# Patient Record
Sex: Male | Born: 1960 | Race: White | Hispanic: No | Smoking: Former smoker
Health system: Southern US, Community
[De-identification: ages and names within clinical notes are randomized; demographics above are authoritative.]

## PROBLEM LIST (undated history)

## (undated) DIAGNOSIS — F1010 Alcohol abuse, uncomplicated: Secondary | ICD-10-CM

## (undated) DIAGNOSIS — H9190 Unspecified hearing loss, unspecified ear: Secondary | ICD-10-CM

## (undated) DIAGNOSIS — E119 Type 2 diabetes mellitus without complications: Secondary | ICD-10-CM

## (undated) DIAGNOSIS — D496 Neoplasm of unspecified behavior of brain: Secondary | ICD-10-CM

## (undated) HISTORY — PX: CERVICAL FUSION: SHX112

---

## 2018-08-26 ENCOUNTER — Inpatient Hospital Stay (HOSPITAL_COMMUNITY)
Admission: EM | Admit: 2018-08-26 | Discharge: 2018-08-30 | DRG: 605 | Disposition: A | Discharge status: Other Acute Inpatient Hospital | Payer: Self-pay | Attending: Internal Medicine | Admitting: Internal Medicine

## 2018-08-26 ENCOUNTER — Encounter (HOSPITAL_COMMUNITY): Payer: Self-pay

## 2018-08-26 ENCOUNTER — Emergency Department (HOSPITAL_COMMUNITY): Payer: Self-pay

## 2018-08-26 DIAGNOSIS — E871 Hypo-osmolality and hyponatremia: Secondary | ICD-10-CM

## 2018-08-26 DIAGNOSIS — X58XXXA Exposure to other specified factors, initial encounter: Secondary | ICD-10-CM | POA: Diagnosis present

## 2018-08-26 DIAGNOSIS — F4310 Post-traumatic stress disorder, unspecified: Secondary | ICD-10-CM | POA: Diagnosis present

## 2018-08-26 DIAGNOSIS — S61502A Unspecified open wound of left wrist, initial encounter: Secondary | ICD-10-CM

## 2018-08-26 DIAGNOSIS — E1165 Type 2 diabetes mellitus with hyperglycemia: Secondary | ICD-10-CM | POA: Diagnosis present

## 2018-08-26 DIAGNOSIS — E119 Type 2 diabetes mellitus without complications: Secondary | ICD-10-CM

## 2018-08-26 DIAGNOSIS — Z6832 Body mass index (BMI) 32.0-32.9, adult: Secondary | ICD-10-CM

## 2018-08-26 DIAGNOSIS — H9190 Unspecified hearing loss, unspecified ear: Secondary | ICD-10-CM | POA: Diagnosis present

## 2018-08-26 DIAGNOSIS — R7989 Other specified abnormal findings of blood chemistry: Secondary | ICD-10-CM | POA: Diagnosis present

## 2018-08-26 DIAGNOSIS — R2681 Unsteadiness on feet: Secondary | ICD-10-CM | POA: Diagnosis present

## 2018-08-26 DIAGNOSIS — F1010 Alcohol abuse, uncomplicated: Secondary | ICD-10-CM

## 2018-08-26 DIAGNOSIS — F10239 Alcohol dependence with withdrawal, unspecified: Secondary | ICD-10-CM | POA: Diagnosis present

## 2018-08-26 DIAGNOSIS — F332 Major depressive disorder, recurrent severe without psychotic features: Secondary | ICD-10-CM | POA: Diagnosis present

## 2018-08-26 DIAGNOSIS — R740 Nonspecific elevation of levels of transaminase and lactic acid dehydrogenase [LDH]: Secondary | ICD-10-CM

## 2018-08-26 DIAGNOSIS — S61512A Laceration without foreign body of left wrist, initial encounter: Principal | ICD-10-CM | POA: Diagnosis present

## 2018-08-26 DIAGNOSIS — R630 Anorexia: Secondary | ICD-10-CM | POA: Diagnosis present

## 2018-08-26 DIAGNOSIS — D496 Neoplasm of unspecified behavior of brain: Secondary | ICD-10-CM

## 2018-08-26 DIAGNOSIS — R7401 Elevation of levels of liver transaminase levels: Secondary | ICD-10-CM

## 2018-08-26 DIAGNOSIS — R945 Abnormal results of liver function studies: Secondary | ICD-10-CM

## 2018-08-26 DIAGNOSIS — K760 Fatty (change of) liver, not elsewhere classified: Secondary | ICD-10-CM | POA: Diagnosis present

## 2018-08-26 DIAGNOSIS — Z62810 Personal history of physical and sexual abuse in childhood: Secondary | ICD-10-CM | POA: Diagnosis present

## 2018-08-26 DIAGNOSIS — T730XXA Starvation, initial encounter: Secondary | ICD-10-CM | POA: Diagnosis present

## 2018-08-26 DIAGNOSIS — R45851 Suicidal ideations: Secondary | ICD-10-CM

## 2018-08-26 DIAGNOSIS — Z23 Encounter for immunization: Secondary | ICD-10-CM

## 2018-08-26 DIAGNOSIS — R634 Abnormal weight loss: Secondary | ICD-10-CM | POA: Diagnosis present

## 2018-08-26 DIAGNOSIS — F1020 Alcohol dependence, uncomplicated: Secondary | ICD-10-CM | POA: Diagnosis present

## 2018-08-26 DIAGNOSIS — R739 Hyperglycemia, unspecified: Secondary | ICD-10-CM

## 2018-08-26 DIAGNOSIS — E860 Dehydration: Secondary | ICD-10-CM | POA: Diagnosis present

## 2018-08-26 DIAGNOSIS — E8729 Other acidosis: Secondary | ICD-10-CM

## 2018-08-26 DIAGNOSIS — E872 Acidosis: Secondary | ICD-10-CM

## 2018-08-26 DIAGNOSIS — T1491XA Suicide attempt, initial encounter: Secondary | ICD-10-CM | POA: Diagnosis present

## 2018-08-26 DIAGNOSIS — Y906 Blood alcohol level of 120-199 mg/100 ml: Secondary | ICD-10-CM | POA: Diagnosis present

## 2018-08-26 DIAGNOSIS — K7010 Alcoholic hepatitis without ascites: Secondary | ICD-10-CM | POA: Diagnosis present

## 2018-08-26 DIAGNOSIS — X788XXA Intentional self-harm by other sharp object, initial encounter: Secondary | ICD-10-CM | POA: Diagnosis present

## 2018-08-26 DIAGNOSIS — D649 Anemia, unspecified: Secondary | ICD-10-CM | POA: Diagnosis present

## 2018-08-26 DIAGNOSIS — Z87891 Personal history of nicotine dependence: Secondary | ICD-10-CM

## 2018-08-26 DIAGNOSIS — R0602 Shortness of breath: Secondary | ICD-10-CM | POA: Diagnosis present

## 2018-08-26 DIAGNOSIS — E440 Moderate protein-calorie malnutrition: Secondary | ICD-10-CM | POA: Diagnosis present

## 2018-08-26 DIAGNOSIS — E876 Hypokalemia: Secondary | ICD-10-CM

## 2018-08-26 HISTORY — DX: Type 2 diabetes mellitus without complications: E11.9

## 2018-08-26 HISTORY — DX: Neoplasm of unspecified behavior of brain: D49.6

## 2018-08-26 HISTORY — DX: Unspecified hearing loss, unspecified ear: H91.90

## 2018-08-26 HISTORY — DX: Alcohol abuse, uncomplicated: F10.10

## 2018-08-26 LAB — ETHANOL: Alcohol, Ethyl (B): 169 mg/dL — ABNORMAL HIGH (ref <10)

## 2018-08-26 LAB — CBC WITH DIFFERENTIAL/PLATELET
ABS IMMATURE GRANULOCYTES: 0.47 10*3/uL — AB (ref 0.00–0.07)
Basophils Absolute: 0 10*3/uL (ref 0.0–0.1)
Basophils Relative: 0 %
Eosinophils Absolute: 0 10*3/uL (ref 0.0–0.5)
Eosinophils Relative: 0 %
HCT: 42.9 % (ref 39.0–52.0)
Hemoglobin: 15.1 g/dL (ref 13.0–17.0)
Immature Granulocytes: 4 %
Lymphocytes Relative: 17 %
Lymphs Abs: 2 10*3/uL (ref 0.7–4.0)
MCH: 31.7 pg (ref 26.0–34.0)
MCHC: 35.2 g/dL (ref 30.0–36.0)
MCV: 89.9 fL (ref 80.0–100.0)
MONO ABS: 1.2 10*3/uL — AB (ref 0.1–1.0)
Monocytes Relative: 10 %
NEUTROS ABS: 8.1 10*3/uL — AB (ref 1.7–7.7)
Neutrophils Relative %: 69 %
Platelets: 254 10*3/uL (ref 150–400)
RBC: 4.77 MIL/uL (ref 4.22–5.81)
RDW: 13 % (ref 11.5–15.5)
WBC: 11.8 10*3/uL — ABNORMAL HIGH (ref 4.0–10.5)
nRBC: 0 % (ref 0.0–0.2)

## 2018-08-26 LAB — COMPREHENSIVE METABOLIC PANEL
ALT: 206 U/L — AB (ref 0–44)
AST: 150 U/L — ABNORMAL HIGH (ref 15–41)
Albumin: 3.9 g/dL (ref 3.5–5.0)
Alkaline Phosphatase: 179 U/L — ABNORMAL HIGH (ref 38–126)
Anion gap: 21 — ABNORMAL HIGH (ref 5–15)
BUN: 14 mg/dL (ref 6–20)
CO2: 29 mmol/L (ref 22–32)
Calcium: 8.2 mg/dL — ABNORMAL LOW (ref 8.9–10.3)
Chloride: 79 mmol/L — ABNORMAL LOW (ref 98–111)
Creatinine, Ser: 0.94 mg/dL (ref 0.61–1.24)
GFR calc Af Amer: >60 mL/min (ref >60)
GFR calc non Af Amer: >60 mL/min (ref >60)
Glucose, Bld: 349 mg/dL — ABNORMAL HIGH (ref 70–99)
Potassium: 2.8 mmol/L — ABNORMAL LOW (ref 3.5–5.1)
Sodium: 129 mmol/L — ABNORMAL LOW (ref 135–145)
Total Bilirubin: 3.2 mg/dL — ABNORMAL HIGH (ref 0.3–1.2)
Total Protein: 7.1 g/dL (ref 6.5–8.1)

## 2018-08-26 MED ORDER — POTASSIUM CHLORIDE 10 MEQ/100ML IV SOLN
10.0000 meq | INTRAVENOUS | Status: AC
  Administered 2018-08-27 (×2): 10 meq via INTRAVENOUS
  Filled 2018-08-26 (×2): qty 100

## 2018-08-26 MED ORDER — POTASSIUM CHLORIDE CRYS ER 20 MEQ PO TBCR
40.0000 meq | EXTENDED_RELEASE_TABLET | Freq: Once | ORAL | Status: AC
  Administered 2018-08-27: 40 meq via ORAL
  Filled 2018-08-26: qty 2

## 2018-08-26 MED ORDER — LORAZEPAM 2 MG/ML IJ SOLN
0.0000 mg | Freq: Two times a day (BID) | INTRAMUSCULAR | Status: DC
  Filled 2018-08-26 (×2): qty 1

## 2018-08-26 MED ORDER — SODIUM CHLORIDE 0.9 % IV BOLUS: 1000.0000 mL | Freq: Once | INTRAVENOUS | Status: DC

## 2018-08-26 MED ORDER — INSULIN REGULAR(HUMAN) IN NACL 100-0.9 UT/100ML-% IV SOLN: INTRAVENOUS | Status: DC

## 2018-08-26 MED ORDER — INSULIN ASPART 100 UNIT/ML ~~LOC~~ SOLN
2.0000 [IU] | Freq: Once | SUBCUTANEOUS | Status: AC
  Administered 2018-08-27: 2 [IU] via SUBCUTANEOUS
  Filled 2018-08-26: qty 1

## 2018-08-26 MED ORDER — DEXTROSE-NACL 5-0.45 % IV SOLN: INTRAVENOUS | Status: DC

## 2018-08-26 MED ORDER — SODIUM CHLORIDE 0.9 % IV SOLN
INTRAVENOUS | Status: DC
  Administered 2018-08-27 – 2018-08-29 (×4): via INTRAVENOUS

## 2018-08-26 MED ORDER — INSULIN REGULAR BOLUS VIA INFUSION
0.0000 [IU] | Freq: Three times a day (TID) | INTRAVENOUS | Status: DC
  Filled 2018-08-26: qty 10

## 2018-08-26 MED ORDER — LORAZEPAM 1 MG PO TABS
0.0000 mg | ORAL_TABLET | Freq: Two times a day (BID) | ORAL | Status: DC
  Administered 2018-08-29 – 2018-08-30 (×3): 2 mg via ORAL
  Filled 2018-08-26: qty 2
  Filled 2018-08-26 (×2): qty 1
  Filled 2018-08-26 (×2): qty 2

## 2018-08-26 MED ORDER — DEXTROSE 50 % IV SOLN: 25.0000 mL | INTRAVENOUS | Status: DC | PRN

## 2018-08-26 MED ORDER — THIAMINE HCL 100 MG/ML IJ SOLN: 100.0000 mg | Freq: Every day | INTRAMUSCULAR | Status: DC

## 2018-08-26 MED ORDER — MAGNESIUM SULFATE 2 GM/50ML IV SOLN
2.0000 g | Freq: Once | INTRAVENOUS | Status: AC
  Administered 2018-08-27: 2 g via INTRAVENOUS
  Filled 2018-08-26: qty 50

## 2018-08-26 MED ORDER — LORAZEPAM 1 MG PO TABS
0.0000 mg | ORAL_TABLET | Freq: Four times a day (QID) | ORAL | Status: AC
  Administered 2018-08-27 (×2): 1 mg via ORAL
  Administered 2018-08-28 (×3): 2 mg via ORAL
  Filled 2018-08-26: qty 1
  Filled 2018-08-26 (×3): qty 2
  Filled 2018-08-26: qty 1
  Filled 2018-08-26 (×2): qty 2

## 2018-08-26 MED ORDER — SODIUM CHLORIDE 0.9 % IV SOLN: INTRAVENOUS | Status: DC

## 2018-08-26 MED ORDER — VITAMIN B-1 100 MG PO TABS
100.0000 mg | ORAL_TABLET | Freq: Every day | ORAL | Status: DC
  Administered 2018-08-27 – 2018-08-30 (×4): 100 mg via ORAL
  Filled 2018-08-26 (×4): qty 1

## 2018-08-26 MED ORDER — TETANUS-DIPHTH-ACELL PERTUSSIS 5-2.5-18.5 LF-MCG/0.5 IM SUSP
0.5000 mL | Freq: Once | INTRAMUSCULAR | Status: AC
  Administered 2018-08-27: 0.5 mL via INTRAMUSCULAR
  Filled 2018-08-26: qty 0.5

## 2018-08-26 MED ORDER — SODIUM CHLORIDE 0.9 % IV BOLUS
1000.0000 mL | Freq: Once | INTRAVENOUS | Status: AC
  Administered 2018-08-27: 1000 mL via INTRAVENOUS

## 2018-08-26 MED ORDER — LORAZEPAM 2 MG/ML IJ SOLN
0.0000 mg | Freq: Four times a day (QID) | INTRAMUSCULAR | Status: AC
  Administered 2018-08-27: 2 mg via INTRAVENOUS
  Filled 2018-08-26: qty 1

## 2018-08-26 MED ORDER — INSULIN ASPART 100 UNIT/ML ~~LOC~~ SOLN: 2.0000 [IU] | Freq: Once | SUBCUTANEOUS | Status: DC

## 2018-08-26 NOTE — BH Assessment (Addendum)
Assessment Note  Todd Rangel is an 58 y.o. male who presents to the ED voluntarily following a suicide attempt. Pt's daughter conducted a wellness check on the pt and when [employer] arrived, pt was found "near 4 empty bottles of liquor and wine" according to triage reports. Pt admits he cut his wrists in a suicide attempt and states he continues to feel suicidal at present. Pt denies any prior suicide attempts and when asked what triggered this current attempt pt stated "I just don't want to live anymore." Pt states he is worried about his massive debt including student loans, he relapsed on alcohol in 2017, and he was recently dx with a brain tumor in November 2019. Pt states he has lost about 20-30 lbs in the past month due to poor appetite. Pt also endorses poor sleeping habits and states he only sleeps after he has consumed heavy amounts of alcohol. Pt denies AVH at present but endorses tactile hallucinations when he is withdrawing from alcohol.   TTS contacted triage nurse Maylon Cos, RN to advise of inpt recommendation. EDP [name], DO contacted to advise of disposition and she states the pt has to be medically admitted prior to inpt treatment. EDP inquired if the pt needs to be IVC'd however pt stated during the TTS assessment that he is willing to sign VOL consent for treatment.  Diagnosis: MDD, recurrent episode, w/o psychosis; Alcohol use disorder, severe  Past Medical History:  Past Medical History:  Diagnosis Date  . Alcohol abuse   . Brain tumor ([place])   . Diabetes mellitus without complication ([place])   . 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" ([place]    History reviewed. No pertinent surgical history.  Family History: History reviewed. No pertinent family history.  Social History:  reports that he has quit smoking. He has never used smokeless tobacco. He reports current alcohol use. No history on file for drug.  Additional Social History:  Alcohol / Drug  Use Pain Medications: See MAR Prescriptions: See MAR Over the Counter: See MAR History of alcohol / drug use?: Yes Longest period of sobriety (when/how long): 14 years Negative Consequences of Use: Financial, Personal relationships Withdrawal Symptoms: Patient aware of relationship between substance abuse and physical/medical complications Substance #1 Name of Substance 1: Alcohol 1 - Age of First Use: 9 1 - Amount (size/oz): excessive 1 - Frequency: daily 1 - Duration: ongoing 1 - Last Use / Amount: 08/26/2018  CIWA: CIWA-Ar BP: 137/90 Pulse Rate: 91 Nausea and Vomiting: no nausea and no vomiting Tactile Disturbances: none Tremor: no tremor Auditory Disturbances: not present Paroxysmal Sweats: no sweat visible Visual Disturbances: not present Anxiety: no anxiety, at ease Headache, Fullness in Head: none present Agitation: normal activity Orientation and Clouding of Sensorium: oriented and can do serial additions CIWA-Ar Total: 0 COWS:    Allergies: No Known Allergies  Home Medications: (Not in a hospital admission)   OB/GYN Status:  No LMP for male patient.  General Assessment Data [facility] of Assessment: [facility] TTS Assessment: In system Is this a Tele or Face-to-Face Assessment?: Face-to-Face Is this an Initial Assessment or a Re-assessment for this encounter?: Initial Assessment Patient Accompanied by:: N/A Language Other than English: No Living Arrangements: Other (Comment) What gender do you identify as?: Male Marital status: Divorced Pregnancy Status: No Living Arrangements: Alone Can pt return to current living arrangement?: Yes Admission Status: Voluntary Is patient capable of signing voluntary admission?: Yes Referral Source: Self/Family/Friend Insurance type:  none     Crisis Care Plan Living Arrangements: Alone [facility]iatrist[facility]   Education Status Is patient currently in school?: No Is the  patient employed, unemployed or receiving disability?: Receiving disability income  Risk to self with the past 6 months Suicidal Ideation: Yes-Currently Present Has patient been a risk to self within the past 6 months prior to admission? : Yes Suicidal Intent: Yes-Currently Present Has patient had any suicidal intent within the past 6 months prior to admission? : Yes Is patient at risk for suicide?: Yes Suicidal Plan?: Yes-Currently Present Has patient had any suicidal plan within the past 6 months prior to admission? : Yes Specify Current Suicidal Plan: pt cut his wrists in a suicide attempt Access to Means: Yes Specify Access to Suicidal Means: pt has access to knives and sharps  What has been your use of drugs/alcohol within the last 12 months?: alcohol Previous Attempts/Gestures: No Other Self Harm Risks: depression, substance abuse, intent and plan for suicide  Triggers for Past Attempts: None known Intentional Self Injurious Behavior: None Family Suicide History: No Recent stressful life event(s): Recent negative physical changes, Financial Problems Persecutory voices/beliefs?: No Depression: Yes Depression Symptoms: Loss of interest in usual pleasures, Feeling worthless/self pity, Isolating, Guilt, Despondent Substance abuse history and/or treatment for substance abuse?: Yes Suicide prevention information given to non-admitted patients: Not applicable  Risk to Others within the past 6 months Homicidal Ideation: No Does patient have any lifetime risk of violence toward others beyond the six months prior to admission? : No Thoughts of Harm to Others: No Current Homicidal Intent: No Current Homicidal Plan: No Access to Homicidal Means: No History of harm to others?: No Assessment of Violence: None Noted Does patient have access to weapons?: No Criminal Charges Pending?: No Does patient have a court date: No Is patient on probation?: No  Psychosis Hallucinations:  Tactile(when withdrawing from alcohol) Delusions: None noted  Mental Status Report Appearance/Hygiene: Disheveled Eye Contact: Good Motor Activity: Freedom of movement Speech: Soft Level of Consciousness: Quiet/awake Mood: Depressed, Despair, Helpless, Sad, Sullen, Worthless, low self-esteem Affect: Flat, Sad, Sullen, Depressed, Blunted Anxiety Level: None Thought Processes: Relevant, Coherent Judgement: Impaired Orientation: Time, Situation, Appropriate for developmental age, Person, Place Obsessive Compulsive Thoughts/Behaviors: None  Cognitive Functioning Concentration: Normal Memory: Recent Intact, Remote Intact Is patient IDD: No Insight: Poor Impulse Control: Poor Appetite: Poor Have you had any weight changes? : Loss Amount of the weight change? (lbs): 30 lbs Sleep: Decreased Total Hours of Sleep: 6 Vegetative Symptoms: None  ADLScreening [facility] Assessment Services) Patient's cognitive ability adequate to safely complete daily activities?: Yes Patient able to express need for assistance with ADLs?: Yes Independently performs ADLs?: Yes (appropriate for developmental age)  Prior Inpatient Therapy Prior Inpatient Therapy: Yes Prior Therapy Dates: 2018 [facility] Reason for Treatment: depression  Prior Outpatient Therapy Prior Outpatient Therapy: Yes Prior Therapy Dates: ongoing [facility] Reason for Treatment: depression Does patient have an ACCT team?: No Does patient have Intensive In-House Services?  : No Does patient have [facility] services? : No Does patient have P4CC services?: No  ADL Screening (condition at time of admission) Patient's cognitive ability adequate to safely complete daily activities?: Yes Is the patient deaf or have difficulty hearing?: No Does the patient have difficulty seeing, even when wearing glasses/contacts?: No Does the patient have difficulty concentrating, remembering, or  making decisions?: No Patient able to express need for assistance with ADLs?:  Yes Does the patient have difficulty dressing or bathing?: No Independently performs ADLs?: Yes (appropriate for developmental age) Does the patient have difficulty walking or climbing stairs?: No Weakness of Legs: None Weakness of Arms/Hands: None  Home Assistive Devices/Equipment Home Assistive Devices/Equipment: None    Abuse/Neglect Assessment (Assessment to be complete while patient is alone) Abuse/Neglect Assessment Can Be Completed: Yes Physical Abuse: Denies Verbal Abuse: Denies Sexual Abuse: Denies Exploitation of patient/patient's resources: Denies Self-Neglect: Denies     Regulatory affairs officer (For Healthcare) Does Patient Have a Medical Advance Directive?: No Would patient like information on creating a medical advance directive?: No - Patient declined          Disposition: TTS contacted triage nurse Maylon Cos, RN to advise of inpt recommendation. EDP [name], DO contacted to advise of disposition and she states the pt has to be medically admitted prior to inpt treatment. EDP inquired if the pt needs to be IVC'd however pt stated during the TTS assessment that he is willing to sign VOL consent for treatment.  Disposition Initial Assessment Completed for this Encounter: Yes Disposition of Patient: Admit Type of inpatient treatment program: Adult Patient refused recommended treatment: No  On Site Evaluation by:   Reviewed with Physician:    Lyanne Co 08/27/2018 12:20 AM

## 2018-08-26 NOTE — Progress Notes (Signed)
Patriciaann Clan, PA recommends inpt treatment.   Lind Covert, MSW, LCSW Therapeutic Triage Specialist  [phone]

## 2018-08-26 NOTE — ED Triage Notes (Signed)
Pt to Ed by [employer] with c/o of suicidal attempt. Pt daughter called [employer] to do a welfare check on patient. Pt was found buy [employer] and daughter fell out on the bed with eyes rolled back and cut his left wrist yesterday with a razor. Pt also stated that he tried to drill bit his head. Pt was found near 4 empty bottles of liquor and wine. Pt was recently diagnosed with a brain tumor in November, and since has relapsed back into drinking alcohol. Pt states that "he tried to kill himself and just wanted to die".

## 2018-08-26 NOTE — Progress Notes (Signed)
TTS contacted triage nurse Maylon Cos, RN to advise of inpt recommendation. EDP [name], DO contacted to advise of disposition and she states the pt has to be medically admitted prior to inpt treatment. EDP inquired if the pt needs to be IVC'd however pt stated during the TTS assessment that he is willing to sign VOL consent for treatment.  Lind Covert, MSW, LCSW Therapeutic Triage Specialist  [phone]

## 2018-08-26 NOTE — H&P (Addendum)
History and Physical    Todd Rangel ZOX:[id number] DOB: 21-Jul-1961 DOA: 08/26/2018  Referring MD/NP/PA:   PCP: [place]   Patient coming from:  The patient is coming from home.  At baseline, pt is independent for most of ADL.        Chief Complaint: Suicidal attempt  HPI: Todd Rangel is a 58 y.o. male with medical history significant of alcohol abuse, diabetes mellitus, recently diagnosed brain tumor, hearing loss, who presents with suicidal attempt.  Per report, pt had suicidal attempt by having cut his left wrist with razor at noon of yesterday. Pt also reported that that he tried to drill bit his head. Pt daughter called [employer] to do a welfare check on patient. Pt was found by [employer] and daughter fell out on the bed, with near 4 empty bottles of liquor and wine. Pt was recently diagnosed with a brain tumor in November, and since has relapsed back into drinking alcohol. Pt states that "he tried to kill himself and just wanted to die.  Endorses hx of "brain tumor" that was dx at [facility] in November, but will not be more specific. For some reason, I could not open "Care Everywhere" tonight.  When I saw pt in ED, he is quiet, no agitation. He has an open cut in left wrist without active bleeding. He states that he has suicidal ideation, but no plan now.  Denies homicidal ideation.  He denies any chest pain, cough, shortness of breath.  No nausea, vomiting, diarrhea, abdominal pain, symptoms of UTI or unilateral weakness.  He states that he has  poor appetite, decreased oral intake recently, and lost nearly 30 pounds recently.  ED Course: pt was found to have WBC 11.8, sodium 129, abnormal liver function (ALP 179, ALT 2 6, AST 150, total bilirubin 3.2), alcohol level 169, potassium 2.8, bicarbonate of 29, creatinine normal, BUN normal, blood sugar 349, anion gap 21, temperature normal, heart rate in 90s, no tachypnea, oxygen saturation 96% on room air.  Chest x-ray negative.  X-ray of left  wrist is negative for bony fracture.   CT of head: 1. Moderate degree of small vessel ischemia in the periventricular, deep and subcortical white matter. Mild sulcal and ventricular prominence advanced for age. 2. No acute intracranial abnormality.  US-RUQ: 1. Hepatomegaly with fatty infiltration of the liver. 2. No gallstone  Review of Systems:   General: no fevers, chills, no body weight gain, has poor appetite, has fatigue HEENT: no blurry vision, hearing changes or sore throat Respiratory: no dyspnea, coughing, wheezing CV: no chest pain, no palpitations GI: no nausea, vomiting, abdominal pain, diarrhea, constipation GU: no dysuria, burning on urination, increased urinary frequency, hematuria  Ext: no leg edema Neuro: no unilateral weakness, numbness, or tingling, no vision change or hearing loss Skin: no rash. Has an open wound in left wrist MSK: No muscle spasm, no deformity, no limitation of range of movement in spin Heme: No easy bruising.  Psych: has suicidal attempt and ideation, no homicidal ideation. Travel history: No recent long distant travel.  Allergy: No Known Allergies  Past Medical History:  Diagnosis Date  . Alcohol abuse   . Brain tumor ([place])   . Diabetes mellitus without complication ([place])   . 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 CERVICAL FUSION     per pt's report    Social History:  reports  that he has quit smoking. He has never used smokeless tobacco. He reports current alcohol use. He reports that he does not use drugs.  Family History:  Family History  Problem Relation Age of Onset  . Lung cancer Mother   . Liver cancer Father      Prior to Admission medications   Not on File    Physical Exam: Vitals:   08/26/18 2105 08/26/18 2123 08/27/18 0109 08/27/18 0112  BP: 137/90 137/90 117/81 117/81  Pulse: 91 91 90 97  Resp: 17  17   Temp: 99 F  (37.2 C)     TempSrc: Oral     SpO2: 96%  100%    General: Not in acute distress.  Thin body habitus. HEENT:       Eyes: PERRL, EOMI, no scleral icterus.       ENT: No discharge from the ears and nose, no pharynx injection, no tonsillar enlargement.        Neck: No JVD, no bruit, no mass felt. Heme: No neck lymph node enlargement. Cardiac: S1/S2, RRR, No murmurs, No gallops or rubs. Respiratory: No rales, wheezing, rhonchi or rubs. GI: Soft, nondistended, nontender, no rebound pain, no organomegaly, BS present. GU: No hematuria Ext: No pitting leg edema bilaterally. 2+DP/PT pulse bilaterally. Musculoskeletal: No joint deformities, No joint redness or warmth, no limitation of ROM in spin. Skin: No rashes. Has an open wound in left wrist, approximately 2.5 cm, no active bleeding Neuro: Alert, oriented X3, cranial nerves II-XII grossly intact, moves all extremities normally.  Psych: Has suicidal ideation, no homicidal ideation. Labs on Admission: I have personally reviewed following labs and imaging studies  CBC: Recent Labs  Lab 08/26/18 2128  WBC 11.8*  NEUTROABS 8.1*  HGB 15.1  HCT 42.9  MCV 89.9  PLT 503   Basic Metabolic Panel: Recent Labs  Lab 08/26/18 2128 08/26/18 2318  NA 129*  --   K 2.8*  --   CL 79*  --   CO2 29  --   GLUCOSE 349*  --   BUN 14  --   CREATININE 0.94  --   CALCIUM 8.2*  --   MG  --  2.6*   GFR: CrCl cannot be calculated (Unknown ideal weight.). Liver Function Tests: Recent Labs  Lab 08/26/18 2128  AST 150*  ALT 206*  ALKPHOS 179*  BILITOT 3.2*  PROT 7.1  ALBUMIN 3.9   No results for input(s): LIPASE, AMYLASE in the last 168 hours. No results for input(s): AMMONIA in the last 168 hours. Coagulation Profile: No results for input(s): INR, PROTIME in the last 168 hours. Cardiac Enzymes: No results for input(s): CKTOTAL, CKMB, CKMBINDEX, TROPONINI in the last 168 hours. BNP (last 3 results) No results for input(s): PROBNP in the  last 8760 hours. HbA1C: No results for input(s): HGBA1C in the last 72 hours. CBG: No results for input(s): GLUCAP in the last 168 hours. Lipid Profile: No results for input(s): CHOL, HDL, LDLCALC, TRIG, CHOLHDL, LDLDIRECT in the last 72 hours. Thyroid Function Tests: No results for input(s): TSH, T4TOTAL, FREET4, T3FREE, THYROIDAB in the last 72 hours. Anemia Panel: No results for input(s): VITAMINB12, FOLATE, FERRITIN, TIBC, IRON, RETICCTPCT in the last 72 hours. Urine analysis:    Component Value Date/Time   COLORURINE AMBER (A) 08/26/2018 2349   APPEARANCEUR CLEAR 08/26/2018 2349   LABSPEC 1.030 08/26/2018 2349   PHURINE 6.0 08/26/2018 2349   GLUCOSEU >=500 (A) 08/26/2018 2349   HGBUR SMALL (A) 08/26/2018  [place] 08/26/2018 2349   KETONESUR 20 (A) 08/26/2018 2349   PROTEINUR 30 (A) 08/26/2018 2349   NITRITE NEGATIVE 08/26/2018 2349   LEUKOCYTESUR NEGATIVE 08/26/2018 2349   Sepsis Labs: @LABRCNTIP (procalcitonin:4,lacticidven:4) )No results found for this or any previous visit (from the past 240 hour(s)).   Radiological Exams on Admission: Dg Chest 2 View  Result Date: 08/26/2018 CLINICAL DATA:  Suicidal attempt.  ETOH. EXAM: CHEST - 2 VIEW COMPARISON:  None. FINDINGS: The cardiomediastinal silhouette is unremarkable. There is no evidence of focal airspace disease, pulmonary edema, suspicious pulmonary nodule/mass, pleural effusion, or pneumothorax. No acute bony abnormalities are identified. IMPRESSION: No active cardiopulmonary disease. Electronically Signed   By: Margarette Canada M.D.   On: 08/26/2018 21:54   Dg Wrist Complete Left  Result Date: 08/26/2018 CLINICAL DATA:  Laceration to LEFT wrist. EXAM: LEFT WRIST - COMPLETE 3+ VIEW COMPARISON:  None. FINDINGS: No acute fracture, subluxation or dislocation. Mild soft tissue swelling noted. No radiopaque foreign body. No acute bony abnormalities identified. IMPRESSION: Mild soft tissue swelling without acute  bony abnormality or radiopaque foreign body. Electronically Signed   By: Margarette Canada M.D.   On: 08/26/2018 21:52   Ct Head Wo Contrast  Result Date: 08/26/2018 CLINICAL DATA:  Suicidal attempt. Alcohol related disorder. EXAM: CT HEAD WITHOUT CONTRAST TECHNIQUE: Contiguous axial images were obtained from the base of the skull through the vertex without intravenous contrast. COMPARISON:  None. FINDINGS: Brain: Moderate degree of small vessel ischemia in the periventricular, deep and subcortical white matter. No acute intraparenchymal hemorrhage, midline shift or edema. Mild sulcal and ventricular prominence advanced for age. Midline fourth ventricle and basal cisterns without effacement. No definite intra-axial mass nor extra-axial fluid. Vascular: No hyperdense vessel sign. Skull: Normal. Negative for fracture or focal lesion. Sinuses/Orbits: No acute finding. Other: None IMPRESSION: 1. Moderate degree of small vessel ischemia in the periventricular, deep and subcortical white matter. Mild sulcal and ventricular prominence advanced for age. 2. No acute intracranial abnormality. Electronically Signed   By: Ashley Royalty M.D.   On: 08/26/2018 21:49   US Abdomen Limited  Result Date: 08/27/2018 CLINICAL DATA:  58 year old male with elevated LFTs. EXAM: ULTRASOUND ABDOMEN LIMITED RIGHT UPPER QUADRANT COMPARISON:  None. FINDINGS: Gallbladder: Small amount of sludge in the gallbladder. No gallstone, gallbladder wall thickening or pericholecystic fluid. Negative sonographic Murphy's sign. Common bile duct: Not visualized. Liver: There is diffuse increased liver echogenicity most consistent with fatty infiltration. Superimposed fibrosis or inflammation is not excluded. Clinical correlation is recommended. There is liver is enlarged measuring approximately 21 cm in length. Portal vein is patent on color Doppler imaging with normal direction of blood flow towards the liver. IMPRESSION: 1. Hepatomegaly with fatty  infiltration of the liver. 2. No gallstone. Electronically Signed   By: Anner Crete M.D.   On: 08/27/2018 00:38     EKG:   Not done in ED, will get one.   Assessment/Plan Principal Problem:   Suicidal behavior with attempted self-injury [facility]) Active Problems:   Alcohol abuse   Brain tumor ([place])   Diabetes mellitus without complication ([place])   Hyponatremia   Abnormal LFTs   Hypokalemia   Open wound of left wrist   Protein-calorie malnutrition, moderate ([place])   Suicidal behavior with attempted self-injury [place]): pt still has SI, but no HI. He is calm now.  Patient was evaluated by the psychiatry in ED, recommended medical clearance before admission to behavioral unit.  -place on tele bed for obs -  Sitter at the bedside, -Suicidal precaution - f/u tylenol and salicylate level -psych consult is requested via Epic   Alcohol abuse: -CiWA protocol  Diabetes mellitus without complication: Blood sugar is elevated at 349.  No A1c data is available.  Patient belongs to [facility].  Patient has anion gap 21, but bicarbonate is 29.  Urinalysis showed ketones 20, which is likely due to starvation rather than DKA. -Start Lantus 5 units daily -SSI -check A1c -IVF: 2 L normal saline, followed by 125 cc/h  Brain tumor [facility]): no detailed information available.  CT head did not show tumor or midline shift. -May need to get medical record from [place] which is likely difficult.  Hyponatremia: Sodium 129.  Most likely due to decreased oral intake and dehydration. -IV normal saline as above  Abnormal LFTs: US-RUQ showed hepatomegaly with fatty infiltration of the liver and no gallstone. Partially due to alcohol abuse. -check hepatitis panel and HIV antibody -Avoid using liver toxic medications, such as a Tylenol  Hypokalemia: K=2.8  on admission. - Repleted - Check Mg level - Give 1 g of magnesium sulfate  Open wound of left wrist: happened more than 24 hours, will not close now -start  Ancef per pharmacy for prophylaxis -Wound care consult  Protein-calorie malnutrition, moderate ([place]): -Consult to nutrition   DVT ppx: SCD Code Status: Full code Family Communication: None at bed side.   Disposition Plan:  Anticipate discharge [place] called:  none Admission status: Obs / tele      Date of Service 08/27/2018    Ivor Costa [facility]   If 7PM-7AM, please contact night-coverage [email or web address] Password [email or web address] 08/27/2018, 4:16 AM

## 2018-08-26 NOTE — ED Notes (Signed)
Trever Streater (daughter) [phone]

## 2018-08-26 NOTE — ED Provider Notes (Signed)
[facility] DEPT Provider Note   CSN: [id number] Arrival date & time: 08/26/18  2050     History   Chief Complaint Chief Complaint  Patient presents with  . Suicide Attempt    HPI Todd Rangel is a 58 y.o. male.  The history is provided by the patient, the EMS personnel and a relative. History limited by: psychiatric disorder.   Pt was seen at 2115.  Per EMS, pt's daughter and pt: Pt called [employer] to do welfare check on pt. [employer] found pt in bed with multiple empty bottles of liquor and wine. Pt states he cut his left wrist with a razor yesterday in [place], as well as tried to "drill bit" his head. States he "was trying to kill himself," and "just wanted to die." Endorses hx of "brain tumor" that was dx at [facility] in November (the [place] per his daughter), but will not be more specific. Denies AVH, no HI.    Td unknown Past Medical History:  Diagnosis Date  . Alcohol abuse   . Diabetes mellitus without complication ([place])   . 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   History reviewed. No pertinent surgical history.      Home Medications    Prior to Admission medications   Not on File    Family History History reviewed. No pertinent family history.  Social History Social History   Tobacco Use  . Smoking status: Former Research scientist (life sciences)  . Smokeless tobacco: Never Used  Substance Use Topics  . Alcohol use: Yes  . Drug use: Not on file     Allergies   Patient has no known allergies.   Review of Systems Review of Systems  Unable to perform ROS: Psychiatric disorder     Physical Exam Updated Vital Signs BP 137/90   Pulse 91   Temp 99 F (37.2 C) (Oral)   Resp 17   SpO2 96%    Patient Vitals for the past 24 hrs:  BP Temp Temp src Pulse Resp SpO2  08/26/18 2123 137/90 - - 91 - -  08/26/18 2105 137/90 99 F (37.2 C) Oral 91 17 96 %  08/26/18  2059 - - - - - 95 %      Physical Exam 2120: Physical examination:  Nursing notes reviewed; Vital signs and O2 SAT reviewed;  Constitutional: Well developed, Well nourished, Well hydrated, In no acute distress; Head:  Normocephalic, atraumatic. No lacs.; Eyes: EOMI, PERRL, No scleral icterus; ENMT: Mouth and pharynx normal, Mucous membranes moist; Neck: Supple, Full range of motion; Cardiovascular: Regular rate and rhythm; Respiratory: Breath sounds clear, No wheezes.  Speaking full sentences with ease, Normal respiratory effort/excursion; Chest: No deformity, Movement normal; Abdomen: Nondistended; Extremities: No deformity. Dried black blood on left wrist. +two separate approximately 3cm vertical lacs to L volar wrist.  Wounds healing with clots and dried blood. Left wrist and fingers with FROM.  No apparent gross retained foreign body, no apparent significant involvement of deep structures such as bone/joint/tendon/or neurovascular involvement noted.  Baseline strength and sensation to left hand/fingers with normal light touch, distal NMS intact with cap refill <2sec, and strong peripheral pulses.  Left hand with motor strength intact, with motor strength 5/5 through E/F (FDP,FDS) against resistance and 2-point discrimination intact at 79mm in left fingers. NT left elbow/wrist/hand/fingers..; Neuro: AA&Ox3, Major CN grossly intact.  Speech clear. No  gross focal motor deficits in extremities. Climbs on and off stretcher easily by himself. Gait steady.; Skin: Color normal, Warm, Dry.; Psych:  Affect flat.    ED Treatments / Results  Labs (all labs ordered are listed, but only abnormal results are displayed)   EKG None  Radiology   Procedures Procedures (including critical care time)  Medications Ordered in ED Medications  Tdap (BOOSTRIX) injection 0.5 mL (has no administration in time range)     Initial Impression / Assessment and Plan / ED Course  I have reviewed the triage vital  signs and the nursing notes.  Pertinent labs & imaging results that were available during my care of the patient were reviewed by me and considered in my medical decision making (see chart for details).  MDM Reviewed: nursing note and vitals Interpretation: x-ray, labs and CT scan Total time providing critical care: 30-74 minutes. This excludes time spent performing separately reportable procedures and services. Consults: admitting MD   CRITICAL CARE Performed by: [name] Total critical care time: 35 minutes Critical care time was exclusive of separately billable procedures and treating other patients. Critical care was necessary to treat or prevent imminent or life-threatening deterioration. Critical care was time spent personally by me on the following activities: development of treatment plan with patient and/or surrogate as well as nursing, discussions with consultants, evaluation of patient's response to treatment, examination of patient, obtaining history from patient or surrogate, ordering and performing treatments and interventions, ordering and review of laboratory studies, ordering and review of radiographic studies, pulse oximetry and re-evaluation of patient's condition.   Results for orders placed or performed during the hospital encounter of 08/26/18  Comprehensive metabolic panel  Result Value Ref Range   Sodium 129 (L) 135 - 145 mmol/L   Potassium 2.8 (L) 3.5 - 5.1 mmol/L   Chloride 79 (L) 98 - 111 mmol/L   CO2 29 22 - 32 mmol/L   Glucose, Bld 349 (H) 70 - 99 mg/dL   BUN 14 6 - 20 mg/dL   Creatinine, Ser 0.94 0.61 - 1.24 mg/dL   Calcium 8.2 (L) 8.9 - 10.3 mg/dL   Total Protein 7.1 6.5 - 8.1 g/dL   Albumin 3.9 3.5 - 5.0 g/dL   AST 150 (H) 15 - 41 U/L   ALT 206 (H) 0 - 44 U/L   Alkaline Phosphatase 179 (H) 38 - 126 U/L   Total Bilirubin 3.2 (H) 0.3 - 1.2 mg/dL   GFR calc non Af Amer >60 >60 mL/min   GFR calc Af Amer >60 >60 mL/min   Anion gap 21 (H) 5 - 15   Ethanol  Result Value Ref Range   Alcohol, Ethyl (B) 169 (H) <10 mg/dL  CBC with Differential/Platelet  Result Value Ref Range   WBC 11.8 (H) 4.0 - 10.5 K/uL   RBC 4.77 4.22 - 5.81 MIL/uL   Hemoglobin 15.1 13.0 - 17.0 g/dL   HCT 42.9 39.0 - 52.0 %   MCV 89.9 80.0 - 100.0 fL   MCH 31.7 26.0 - 34.0 pg   MCHC 35.2 30.0 - 36.0 g/dL   RDW 13.0 11.5 - 15.5 %   Platelets 254 150 - 400 K/uL   nRBC 0.0 0.0 - 0.2 %   Neutrophils Relative % 69 %   Neutro Abs 8.1 (H) 1.7 - 7.7 K/uL   Lymphocytes Relative 17 %   Lymphs Abs 2.0 0.7 - 4.0 K/uL   Monocytes Relative 10 %   Monocytes Absolute 1.2 (H)  0.1 - 1.0 K/uL   Eosinophils Relative 0 %   Eosinophils Absolute 0.0 0.0 - 0.5 K/uL   Basophils Relative 0 %   Basophils Absolute 0.0 0.0 - 0.1 K/uL   Immature Granulocytes 4 %   Abs Immature Granulocytes 0.47 (H) 0.00 - 0.07 K/uL   Dg Chest 2 View Result Date: 08/26/2018 CLINICAL DATA:  Suicidal attempt.  ETOH. EXAM: CHEST - 2 VIEW COMPARISON:  None. FINDINGS: The cardiomediastinal silhouette is unremarkable. There is no evidence of focal airspace disease, pulmonary edema, suspicious pulmonary nodule/mass, pleural effusion, or pneumothorax. No acute bony abnormalities are identified. IMPRESSION: No active cardiopulmonary disease. Electronically Signed   By: Margarette Canada M.D.   On: 08/26/2018 21:54   Dg Wrist Complete Left Result Date: 08/26/2018 CLINICAL DATA:  Laceration to LEFT wrist. EXAM: LEFT WRIST - COMPLETE 3+ VIEW COMPARISON:  None. FINDINGS: No acute fracture, subluxation or dislocation. Mild soft tissue swelling noted. No radiopaque foreign body. No acute bony abnormalities identified. IMPRESSION: Mild soft tissue swelling without acute bony abnormality or radiopaque foreign body. Electronically Signed   By: Margarette Canada M.D.   On: 08/26/2018 21:52   Ct Head Wo Contrast Result Date: 08/26/2018 CLINICAL DATA:  Suicidal attempt. Alcohol related disorder. EXAM: CT HEAD WITHOUT CONTRAST  TECHNIQUE: Contiguous axial images were obtained from the base of the skull through the vertex without intravenous contrast. COMPARISON:  None. FINDINGS: Brain: Moderate degree of small vessel ischemia in the periventricular, deep and subcortical white matter. No acute intraparenchymal hemorrhage, midline shift or edema. Mild sulcal and ventricular prominence advanced for age. Midline fourth ventricle and basal cisterns without effacement. No definite intra-axial mass nor extra-axial fluid. Vascular: No hyperdense vessel sign. Skull: Normal. Negative for fracture or focal lesion. Sinuses/Orbits: No acute finding. Other: None IMPRESSION: 1. Moderate degree of small vessel ischemia in the periventricular, deep and subcortical white matter. Mild sulcal and ventricular prominence advanced for age. 2. No acute intracranial abnormality. Electronically Signed   By: Ashley Royalty M.D.   On: 08/26/2018 21:49    2350:   Td updated. Left wrist wound too old to close with sutures; wound care provided. Potassium repleted PO; magnesium pending. LFT's elevated, abd benign on exam; no old to compare. [place] abd ordered. IVF and SQ insulin ordered for elevated glucose and AG. Suspect likely alcoholic ketoacidosis more than DKA; UA pending. Na corrects to 133 for glucose. Pt will need medical admission before psych admission; IVC paperwork completed. CIWA protocol ordered with thiamine.  T/C returned from [facility] Dr. Blaine Hamper, case discussed, including:  HPI, pertinent PM/SHx, VS/PE, dx testing, ED course and treatment:  Agreeable to admit.     Final Clinical Impressions(s) / ED Diagnoses   Final diagnoses:  None    ED Discharge Orders    None       [name], DO 08/30/18 1330

## 2018-08-26 NOTE — ED Notes (Signed)
Bed: KP22 Expected date:  Expected time:  Means of arrival:  Comments: TR6

## 2018-08-27 ENCOUNTER — Encounter (HOSPITAL_COMMUNITY): Payer: Self-pay | Admitting: Internal Medicine

## 2018-08-27 ENCOUNTER — Other Ambulatory Visit: Payer: Self-pay

## 2018-08-27 DIAGNOSIS — E440 Moderate protein-calorie malnutrition: Secondary | ICD-10-CM | POA: Diagnosis present

## 2018-08-27 DIAGNOSIS — S61502A Unspecified open wound of left wrist, initial encounter: Secondary | ICD-10-CM | POA: Diagnosis present

## 2018-08-27 DIAGNOSIS — T1491XA Suicide attempt, initial encounter: Secondary | ICD-10-CM | POA: Insufficient documentation

## 2018-08-27 DIAGNOSIS — F332 Major depressive disorder, recurrent severe without psychotic features: Secondary | ICD-10-CM | POA: Diagnosis present

## 2018-08-27 LAB — URINALYSIS, ROUTINE W REFLEX MICROSCOPIC
Bacteria, UA: NONE SEEN
Bilirubin Urine: NEGATIVE
Glucose, UA: >=500 mg/dL — AB
Ketones, ur: 20 mg/dL — AB
Leukocytes, UA: NEGATIVE
Nitrite: NEGATIVE
Protein, ur: 30 mg/dL — AB
Specific Gravity, Urine: 1.03 (ref 1.005–1.030)
pH: 6 (ref 5.0–8.0)

## 2018-08-27 LAB — CBG MONITORING, ED
Glucose-Capillary: 255 mg/dL — ABNORMAL HIGH (ref 70–99)
Glucose-Capillary: 342 mg/dL — ABNORMAL HIGH (ref 70–99)
Glucose-Capillary: 280 mg/dL — ABNORMAL HIGH (ref 70–99)

## 2018-08-27 LAB — GLUCOSE, CAPILLARY
GLUCOSE-CAPILLARY: 258 mg/dL — AB (ref 70–99)
Glucose-Capillary: 320 mg/dL — ABNORMAL HIGH (ref 70–99)

## 2018-08-27 LAB — RAPID URINE DRUG SCREEN, HOSP PERFORMED
Amphetamines: NOT DETECTED
Barbiturates: NOT DETECTED
Benzodiazepines: NOT DETECTED
Cocaine: NOT DETECTED
Opiates: NOT DETECTED
Tetrahydrocannabinol: NOT DETECTED

## 2018-08-27 LAB — CBC
HCT: 38.5 % — ABNORMAL LOW (ref 39.0–52.0)
Hemoglobin: 13.3 g/dL (ref 13.0–17.0)
MCH: 30.9 pg (ref 26.0–34.0)
MCHC: 34.5 g/dL (ref 30.0–36.0)
MCV: 89.3 fL (ref 80.0–100.0)
Platelets: 196 10*3/uL (ref 150–400)
RBC: 4.31 MIL/uL (ref 4.22–5.81)
RDW: 13 % (ref 11.5–15.5)
WBC: 11 10*3/uL — AB (ref 4.0–10.5)
nRBC: 0 % (ref 0.0–0.2)

## 2018-08-27 LAB — BASIC METABOLIC PANEL
Anion gap: 12 (ref 5–15)
BUN: 13 mg/dL (ref 6–20)
CO2: 29 mmol/L (ref 22–32)
Calcium: 7.4 mg/dL — ABNORMAL LOW (ref 8.9–10.3)
Chloride: 88 mmol/L — ABNORMAL LOW (ref 98–111)
Creatinine, Ser: 0.83 mg/dL (ref 0.61–1.24)
GFR calc Af Amer: >60 mL/min (ref >60)
GFR calc non Af Amer: >60 mL/min (ref >60)
Glucose, Bld: 231 mg/dL — ABNORMAL HIGH (ref 70–99)
Potassium: 3.8 mmol/L (ref 3.5–5.1)
Sodium: 129 mmol/L — ABNORMAL LOW (ref 135–145)

## 2018-08-27 LAB — HIV ANTIBODY (ROUTINE TESTING W REFLEX): HIV Screen 4th Generation wRfx: NONREACTIVE

## 2018-08-27 LAB — LACTIC ACID, PLASMA: Lactic Acid, Venous: 1.6 mmol/L (ref 0.5–1.9)

## 2018-08-27 LAB — HEMOGLOBIN A1C
Hgb A1c MFr Bld: 9.9 % — ABNORMAL HIGH (ref 4.8–5.6)
Mean Plasma Glucose: 237.43 mg/dL

## 2018-08-27 LAB — MAGNESIUM: Magnesium: 2.6 mg/dL — ABNORMAL HIGH (ref 1.7–2.4)

## 2018-08-27 LAB — ACETAMINOPHEN LEVEL: Acetaminophen (Tylenol), Serum: <10 ug/mL — ABNORMAL LOW (ref 10–30)

## 2018-08-27 LAB — SALICYLATE LEVEL: Salicylate Lvl: <7 mg/dL (ref 2.8–30.0)

## 2018-08-27 MED ORDER — INSULIN ASPART 100 UNIT/ML ~~LOC~~ SOLN: 0.0000 [IU] | Freq: Three times a day (TID) | SUBCUTANEOUS | Status: DC

## 2018-08-27 MED ORDER — INSULIN ASPART 100 UNIT/ML ~~LOC~~ SOLN
0.0000 [IU] | Freq: Three times a day (TID) | SUBCUTANEOUS | Status: DC
  Administered 2018-08-27: 7 [IU] via SUBCUTANEOUS
  Administered 2018-08-27 (×2): 5 [IU] via SUBCUTANEOUS
  Administered 2018-08-28 (×2): 7 [IU] via SUBCUTANEOUS
  Administered 2018-08-28: 5 [IU] via SUBCUTANEOUS
  Administered 2018-08-29: 3 [IU] via SUBCUTANEOUS
  Administered 2018-08-29: 5 [IU] via SUBCUTANEOUS
  Administered 2018-08-29: 3 [IU] via SUBCUTANEOUS
  Administered 2018-08-30: 9 [IU] via SUBCUTANEOUS
  Administered 2018-08-30: 5 [IU] via SUBCUTANEOUS
  Filled 2018-08-27 (×2): qty 1

## 2018-08-27 MED ORDER — LORAZEPAM 2 MG/ML IJ SOLN
1.0000 mg | INTRAMUSCULAR | Status: DC | PRN
  Administered 2018-08-28 – 2018-08-29 (×2): 2 mg via INTRAVENOUS
  Administered 2018-08-29: 1 mg via INTRAVENOUS
  Administered 2018-08-29 (×3): 2 mg via INTRAVENOUS
  Administered 2018-08-30: 1 mg via INTRAVENOUS
  Administered 2018-08-30 (×2): 2 mg via INTRAVENOUS
  Filled 2018-08-27 (×7): qty 1

## 2018-08-27 MED ORDER — MUPIROCIN 2 % EX OINT
TOPICAL_OINTMENT | Freq: Every day | CUTANEOUS | Status: DC
  Administered 2018-08-27: 14:00:00 via TOPICAL
  Administered 2018-08-28 – 2018-08-29 (×2): 1 via TOPICAL
  Administered 2018-08-30: 10:00:00 via TOPICAL
  Filled 2018-08-27 (×2): qty 22

## 2018-08-27 MED ORDER — CEFAZOLIN SODIUM-DEXTROSE 2-4 GM/100ML-% IV SOLN
2.0000 g | Freq: Once | INTRAVENOUS | Status: AC
  Administered 2018-08-27: 2 g via INTRAVENOUS
  Filled 2018-08-27: qty 100

## 2018-08-27 MED ORDER — LORAZEPAM 2 MG/ML IJ SOLN
2.0000 mg | Freq: Once | INTRAMUSCULAR | Status: AC
  Administered 2018-08-27: 2 mg via INTRAVENOUS
  Filled 2018-08-27: qty 1

## 2018-08-27 MED ORDER — INSULIN GLARGINE 100 UNIT/ML ~~LOC~~ SOLN
5.0000 [IU] | Freq: Every day | SUBCUTANEOUS | Status: DC
  Administered 2018-08-27: 5 [IU] via SUBCUTANEOUS
  Filled 2018-08-27 (×2): qty 0.05

## 2018-08-27 MED ORDER — INSULIN ASPART 100 UNIT/ML ~~LOC~~ SOLN
0.0000 [IU] | Freq: Every day | SUBCUTANEOUS | Status: DC
  Administered 2018-08-27: 4 [IU] via SUBCUTANEOUS
  Administered 2018-08-29: 3 [IU] via SUBCUTANEOUS

## 2018-08-27 MED ORDER — PANTOPRAZOLE SODIUM 20 MG PO TBEC
20.0000 mg | DELAYED_RELEASE_TABLET | Freq: Every day | ORAL | Status: DC
  Administered 2018-08-27 – 2018-08-30 (×4): 20 mg via ORAL
  Filled 2018-08-27 (×4): qty 1

## 2018-08-27 MED ORDER — ONDANSETRON HCL 4 MG/2ML IJ SOLN: 4.0000 mg | Freq: Three times a day (TID) | INTRAMUSCULAR | Status: DC | PRN

## 2018-08-27 MED ORDER — CEFAZOLIN SODIUM-DEXTROSE 1-4 GM/50ML-% IV SOLN
1.0000 g | Freq: Three times a day (TID) | INTRAVENOUS | Status: DC
  Administered 2018-08-27 – 2018-08-28 (×3): 1 g via INTRAVENOUS
  Filled 2018-08-27 (×6): qty 50

## 2018-08-27 MED ORDER — SODIUM CHLORIDE 0.9 % IV BOLUS
1000.0000 mL | Freq: Once | INTRAVENOUS | Status: AC
  Administered 2018-08-27: 1000 mL via INTRAVENOUS

## 2018-08-27 MED ORDER — ZOLPIDEM TARTRATE 5 MG PO TABS: 5.0000 mg | ORAL_TABLET | Freq: Every evening | ORAL | Status: DC | PRN

## 2018-08-27 MED ORDER — INSULIN ASPART 100 UNIT/ML ~~LOC~~ SOLN: 0.0000 [IU] | Freq: Every day | SUBCUTANEOUS | Status: DC

## 2018-08-27 MED ORDER — IBUPROFEN 200 MG PO TABS: 400.0000 mg | ORAL_TABLET | Freq: Four times a day (QID) | ORAL | Status: DC | PRN

## 2018-08-27 NOTE — ED Notes (Addendum)
Pt has requested Ativan twice in the last 2 hours. Pt was advised that he was not due to more ativan until 0717.
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
## 2018-08-27 NOTE — ED Notes (Addendum)
Pt. was given a [place] sandwich along with water. Nurse aware.

## 2018-08-27 NOTE — Progress Notes (Addendum)
CIWA 16, unable to give ativan until 2245 based on orders. Paged [facility]. One dose 2 mg ativan IV ordered NOW and order for PRN ordered. Will continue to monitor.

## 2018-08-27 NOTE — Progress Notes (Signed)
Pharmacy Antibiotic Note  Todd Rangel is a 59 y.o. male admitted on 08/26/2018 with wrist laceration.  Pharmacy has been consulted for ancef dosing.  Plan: Ancef 2 Gm x1 the 1 Gm IV q8h F/u wt/ht (not in Epic)     Temp (24hrs), Avg:99 F (37.2 C), Min:99 F (37.2 C), Max:99 F (37.2 C)  Recent Labs  Lab 08/26/18 2128  WBC 11.8*  CREATININE 0.94    CrCl cannot be calculated (Unknown ideal weight.).    No Known Allergies  Antimicrobials this admission: 1/24 ancef >>    >>   Dose adjustments this admission:   Microbiology results:  BCx:   UCx:    Sputum:    MRSA PCR:   Thank you for allowing pharmacy to be a part of this patient's care.  Dorrene German 08/27/2018 2:46 AM

## 2018-08-27 NOTE — ED Notes (Signed)
ED TO INPATIENT HANDOFF REPORT  Name/Age/Gender Todd Rangel 58 y.o. male  Code Status    Code Status Orders  (From admission, onward)         Start     Ordered   08/27/18 0216  Full code  Continuous     08/27/18 0216        Code Status History    Date Active Date Inactive Code Status Order ID Comments User Context   08/26/2018 2313 08/27/2018 0216 Full Code [id number]  [name], DO ED      Home/SNF/Other Home  Chief Complaint Suicide Attempt  Level of Care/Admitting Diagnosis ED Disposition    ED Disposition Condition [facility] Area: [place] [[id number]]  Level of Care: Telemetry [5]  Admit to tele based on following criteria: Other see comments  Comments: Hyponatremia, hypokalemia  Diagnosis: Suicidal behavior with attempted self-injury [facility]) [[id number]]  Admitting Physician: Ivor Costa [[id number]]  Attending Physician: Ivor Costa [[id number]]  PT Class (Do Not Modify): Observation [104]  PT Acc Code (Do Not Modify): Observation [10022]       Medical History Past Medical History:  Diagnosis Date  . Alcohol abuse   . Brain tumor ([place])   . Diabetes mellitus without complication ([place])   . 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" ([place]    Allergies No Known Allergies  IV Location/Drains/Wounds Rangel Lines/Drains/Airways Status   Active Line/Drains/Airways    Name:   Placement date:   Placement time:   Site:   Days:   Peripheral IV 08/26/18 Right Antecubital   08/26/18    2059    Antecubital   1          Labs/Imaging Results for orders placed or performed during the hospital encounter of 08/26/18 (from the past 48 hour(s))  Comprehensive metabolic panel     Status: Abnormal   Collection Time: 08/26/18  9:28 PM  Result Value Ref Range   Sodium 129 (L) 135 - 145 mmol/L   Potassium 2.8 (L) 3.5 - 5.1 mmol/L   Chloride 79 (L) 98 - 111 mmol/L   CO2 29 22 - 32 mmol/L   Glucose, Bld 349 (H)  70 - 99 mg/dL   BUN 14 6 - 20 mg/dL   Creatinine, Ser 0.94 0.61 - 1.24 mg/dL   Calcium 8.2 (L) 8.9 - 10.3 mg/dL   Total Protein 7.1 6.5 - 8.1 g/dL   Albumin 3.9 3.5 - 5.0 g/dL   AST 150 (H) 15 - 41 U/L   ALT 206 (H) 0 - 44 U/L   Alkaline Phosphatase 179 (H) 38 - 126 U/L   Total Bilirubin 3.2 (H) 0.3 - 1.2 mg/dL   GFR calc non Af Amer >60 >60 mL/min   GFR calc Af Amer >60 >60 mL/min   Anion gap 21 (H) 5 - 15    Comment: Performed at [facility], [place] [address] [place], [place] [address]  Ethanol     Status: Abnormal   Collection Time: 08/26/18  9:28 PM  Result Value Ref Range   Alcohol, Ethyl (B) 169 (H) <10 mg/dL    Comment: (NOTE) Lowest detectable limit for serum alcohol is 10 mg/dL. For medical purposes only. Performed at [facility], [place] [address], [place], [place] [address]   Rapid urine drug screen (hospital performed)     Status: None   Collection Time: 08/26/18  9:28 PM  Result  Value Ref Range   Opiates NONE DETECTED NONE DETECTED   Cocaine NONE DETECTED NONE DETECTED   Benzodiazepines NONE DETECTED NONE DETECTED   Amphetamines NONE DETECTED NONE DETECTED   Tetrahydrocannabinol NONE DETECTED NONE DETECTED   Barbiturates NONE DETECTED NONE DETECTED    Comment: (NOTE) DRUG SCREEN FOR MEDICAL PURPOSES ONLY.  IF CONFIRMATION IS NEEDED FOR ANY PURPOSE, NOTIFY LAB WITHIN 5 DAYS. LOWEST DETECTABLE LIMITS FOR URINE DRUG SCREEN Drug Class                     Cutoff (ng/mL) Amphetamine and metabolites    1000 Barbiturate and metabolites    200 Benzodiazepine                 734 Tricyclics and metabolites     300 Opiates and metabolites        300 Cocaine and metabolites        300 THC                            50 Performed at [facility], [place] [address], [place], [place] [address]   CBC with Differential/Platelet     Status: Abnormal   Collection Time: 08/26/18  9:28 PM  Result Value Ref Range   WBC  11.8 (H) 4.0 - 10.5 K/uL   RBC 4.77 4.22 - 5.81 MIL/uL   Hemoglobin 15.1 13.0 - 17.0 g/dL   HCT 42.9 39.0 - 52.0 %   MCV 89.9 80.0 - 100.0 fL   MCH 31.7 26.0 - 34.0 pg   MCHC 35.2 30.0 - 36.0 g/dL   RDW 13.0 11.5 - 15.5 %   Platelets 254 150 - 400 K/uL   nRBC 0.0 0.0 - 0.2 %   Neutrophils Relative % 69 %   Neutro Abs 8.1 (H) 1.7 - 7.7 K/uL   Lymphocytes Relative 17 %   Lymphs Abs 2.0 0.7 - 4.0 K/uL   Monocytes Relative 10 %   Monocytes Absolute 1.2 (H) 0.1 - 1.0 K/uL   Eosinophils Relative 0 %   Eosinophils Absolute 0.0 0.0 - 0.5 K/uL   Basophils Relative 0 %   Basophils Absolute 0.0 0.0 - 0.1 K/uL   Immature Granulocytes 4 %   Abs Immature Granulocytes 0.47 (H) 0.00 - 0.07 K/uL    Comment: Performed at [facility], [place] [address] [place], [place] [address]  Magnesium     Status: Abnormal   Collection Time: 08/26/18 11:18 PM  Result Value Ref Range   Magnesium 2.6 (H) 1.7 - 2.4 mg/dL    Comment: Performed at [facility], [place] [address], [place], [place] [address]  Urinalysis, Routine w reflex microscopic     Status: Abnormal   Collection Time: 08/26/18 11:49 PM  Result Value Ref Range   Color, Urine AMBER (A) YELLOW    Comment: BIOCHEMICALS MAY BE AFFECTED BY COLOR   APPearance CLEAR CLEAR   Specific Gravity, Urine 1.030 1.005 - 1.030   pH 6.0 5.0 - 8.0   Glucose, UA >=500 (A) NEGATIVE mg/dL   Hgb urine dipstick SMALL (A) NEGATIVE   Bilirubin Urine NEGATIVE NEGATIVE   Ketones, ur 20 (A) NEGATIVE mg/dL   Protein, ur 30 (A) NEGATIVE mg/dL   Nitrite NEGATIVE NEGATIVE   Leukocytes, UA NEGATIVE NEGATIVE   RBC / HPF 0-5 0 - 5 RBC/hpf   WBC, UA 0-5 0 - 5 WBC/hpf  Bacteria, UA NONE SEEN NONE SEEN   Amorphous Crystal PRESENT     Comment: Performed at [facility], [place] [address], [place], [place] [address]  Basic metabolic panel     Status: Abnormal   Collection Time: 08/27/18  5:54 AM  Result Value Ref Range    Sodium 129 (L) 135 - 145 mmol/L   Potassium 3.8 3.5 - 5.1 mmol/L    Comment: DELTA CHECK NOTED REPEATED TO VERIFY NO VISIBLE HEMOLYSIS    Chloride 88 (L) 98 - 111 mmol/L   CO2 29 22 - 32 mmol/L   Glucose, Bld 231 (H) 70 - 99 mg/dL   BUN 13 6 - 20 mg/dL   Creatinine, Ser 0.83 0.61 - 1.24 mg/dL   Calcium 7.4 (L) 8.9 - 10.3 mg/dL   GFR calc non Af Amer >60 >60 mL/min   GFR calc Af Amer >60 >60 mL/min   Anion gap 12 5 - 15    Comment: Performed at [facility], [place] [address] [place], [place] [address]  CBC     Status: Abnormal   Collection Time: 08/27/18  5:54 AM  Result Value Ref Range   WBC 11.0 (H) 4.0 - 10.5 K/uL   RBC 4.31 4.22 - 5.81 MIL/uL   Hemoglobin 13.3 13.0 - 17.0 g/dL   HCT 38.5 (L) 39.0 - 52.0 %   MCV 89.3 80.0 - 100.0 fL   MCH 30.9 26.0 - 34.0 pg   MCHC 34.5 30.0 - 36.0 g/dL   RDW 13.0 11.5 - 15.5 %   Platelets 196 150 - 400 K/uL   nRBC 0.0 0.0 - 0.2 %    Comment: Performed at [facility], [place] [address] [place], [place] [address]  Acetaminophen level     Status: Abnormal   Collection Time: 08/27/18  5:55 AM  Result Value Ref Range   Acetaminophen (Tylenol), Serum <10 (L) 10 - 30 ug/mL    Comment: (NOTE) Therapeutic concentrations vary significantly. A range of 10-30 ug/mL  may be an effective concentration for many patients. However, some  are best treated at concentrations outside of this range. Acetaminophen concentrations >150 ug/mL at 4 hours after ingestion  and >50 ug/mL at 12 hours after ingestion are often associated with  toxic reactions. Performed at [facility], [place] [address] [place], [place] [address]   Salicylate level     Status: None   Collection Time: 08/27/18  5:55 AM  Result Value Ref Range   Salicylate Lvl <1.7 2.8 - 30.0 mg/dL    Comment: Performed at [facility], [place] [address], [place], [place] [address]  Lactic acid, plasma     Status: None    Collection Time: 08/27/18  5:55 AM  Result Value Ref Range   Lactic Acid, Venous 1.6 0.5 - 1.9 mmol/L    Comment: Performed at [facility] [place] [address] [place], [place] [address]  CBG monitoring, ED     Status: Abnormal   Collection Time: 08/27/18  7:55 AM  Result Value Ref Range   Glucose-Capillary 255 (H) 70 - 99 mg/dL   Dg Chest 2 View  Result Date: 08/26/2018 CLINICAL DATA:  Suicidal attempt.  ETOH. EXAM: CHEST - 2 VIEW COMPARISON:  None. FINDINGS: The cardiomediastinal silhouette is unremarkable. There is no evidence of focal airspace disease, pulmonary edema, suspicious pulmonary nodule/mass, pleural effusion, or pneumothorax. No acute bony abnormalities are identified. IMPRESSION: No active cardiopulmonary disease. Electronically Signed  By: Margarette Canada M.D.   On: 08/26/2018 21:54   Dg Wrist Complete Left  Result Date: 08/26/2018 CLINICAL DATA:  Laceration to LEFT wrist. EXAM: LEFT WRIST - COMPLETE 3+ VIEW COMPARISON:  None. FINDINGS: No acute fracture, subluxation or dislocation. Mild soft tissue swelling noted. No radiopaque foreign body. No acute bony abnormalities identified. IMPRESSION: Mild soft tissue swelling without acute bony abnormality or radiopaque foreign body. Electronically Signed   By: Margarette Canada M.D.   On: 08/26/2018 21:52   Ct Head Wo Contrast  Result Date: 08/26/2018 CLINICAL DATA:  Suicidal attempt. Alcohol related disorder. EXAM: CT HEAD WITHOUT CONTRAST TECHNIQUE: Contiguous axial images were obtained from the base of the skull through the vertex without intravenous contrast. COMPARISON:  None. FINDINGS: Brain: Moderate degree of small vessel ischemia in the periventricular, deep and subcortical white matter. No acute intraparenchymal hemorrhage, midline shift or edema. Mild sulcal and ventricular prominence advanced for age. Midline fourth ventricle and basal cisterns without effacement. No definite intra-axial mass nor extra-axial fluid.  Vascular: No hyperdense vessel sign. Skull: Normal. Negative for fracture or focal lesion. Sinuses/Orbits: No acute finding. Other: None IMPRESSION: 1. Moderate degree of small vessel ischemia in the periventricular, deep and subcortical white matter. Mild sulcal and ventricular prominence advanced for age. 2. No acute intracranial abnormality. Electronically Signed   By: Ashley Royalty M.D.   On: 08/26/2018 21:49   US Abdomen Limited  Result Date: 08/27/2018 CLINICAL DATA:  58 year old male with elevated LFTs. EXAM: ULTRASOUND ABDOMEN LIMITED RIGHT UPPER QUADRANT COMPARISON:  None. FINDINGS: Gallbladder: Small amount of sludge in the gallbladder. No gallstone, gallbladder wall thickening or pericholecystic fluid. Negative sonographic Murphy's sign. Common bile duct: Not visualized. Liver: There is diffuse increased liver echogenicity most consistent with fatty infiltration. Superimposed fibrosis or inflammation is not excluded. Clinical correlation is recommended. There is liver is enlarged measuring approximately 21 cm in length. Portal vein is patent on color Doppler imaging with normal direction of blood flow towards the liver. IMPRESSION: 1. Hepatomegaly with fatty infiltration of the liver. 2. No gallstone. Electronically Signed   By: Anner Crete M.D.   On: 08/27/2018 00:38   None  Pending Labs Unresulted Labs (From admission, onward)    Start     Ordered   08/27/18 0500  Hemoglobin A1c  Tomorrow morning,   R     08/27/18 0409   08/27/18 0126  Hepatitis panel, acute  Once,   R     08/27/18 0126   08/27/18 0126  HIV Antibody (routine testing w rflx)  Once,   R     08/27/18 0126          Vitals/Pain Today's Vitals   08/27/18 0112 08/27/18 0701 08/27/18 0734 08/27/18 0801  BP: 117/81 (!) 117/95 (!) 117/95   Pulse: 97 98 98   Resp:  17    Temp:      TempSrc:      SpO2:  100%    Weight:    104.3 kg  Height:    5\' 11"  (1.803 m)  PainSc:        Isolation Precautions No active  isolations  Medications Medications  LORazepam (ATIVAN) injection 0-4 mg (2 mg Intravenous Given 08/27/18 0757)    Or  LORazepam (ATIVAN) tablet 0-4 mg ( Oral See Alternative 08/27/18 0757)  LORazepam (ATIVAN) injection 0-4 mg (has no administration in time range)    Or  LORazepam (ATIVAN) tablet 0-4 mg (has no administration in time range)  thiamine (VITAMIN B-1) tablet 100 mg (has no administration in time range)    Or  thiamine (B-1) injection 100 mg (has no administration in time range)  0.9 %  sodium chloride infusion ( Intravenous New Bag/Given 08/27/18 0810)  ondansetron (ZOFRAN) injection 4 mg (has no administration in time range)  zolpidem (AMBIEN) tablet 5 mg (has no administration in time range)  ibuprofen (ADVIL,MOTRIN) tablet 400 mg (has no administration in time range)  insulin glargine (LANTUS) injection 5 Units (5 Units Subcutaneous Given 08/27/18 0317)  insulin aspart (novoLOG) injection 0-9 Units (5 Units Subcutaneous Given 08/27/18 0806)  insulin aspart (novoLOG) injection 0-5 Units (has no administration in time range)  ceFAZolin (ANCEF) IVPB 1 g/50 mL premix (has no administration in time range)  Tdap (BOOSTRIX) injection 0.5 mL (0.5 mLs Intramuscular Given 08/27/18 0049)  sodium chloride 0.9 % bolus 1,000 mL (0 mLs Intravenous Stopped 08/27/18 0227)  potassium chloride SA (K-DUR,KLOR-CON) CR tablet 40 mEq (40 mEq Oral Given 08/27/18 0048)  insulin aspart (novoLOG) injection 2 Units (2 Units Subcutaneous Given 08/27/18 0105)  potassium chloride 10 mEq in 100 mL IVPB (0 mEq Intravenous Stopped 08/27/18 0529)  magnesium sulfate IVPB 2 g 50 mL (0 g Intravenous Stopped 08/27/18 0529)  sodium chloride 0.9 % bolus 1,000 mL (0 mLs Intravenous Stopped 08/27/18 0202)  ceFAZolin (ANCEF) IVPB 2g/100 mL premix (0 g Intravenous Stopped 08/27/18 0659)    Mobility walks with person assist

## 2018-08-27 NOTE — ED Notes (Signed)
Bed: AG53 Expected date:  Expected time:  Means of arrival:  Comments: Hold for 16

## 2018-08-27 NOTE — ED Notes (Addendum)
Spoke with lab to draw blood. Angel from lab stated that she will not be down here until around 0400 due to collecting morning lab specimens.

## 2018-08-27 NOTE — Consult Note (Signed)
[place] Nurse wound consult note Reason for Consult:Trauma wound to left wrist.  Laceration with razor blade. Negative for fracture.  Waited too long for medical attention to receive sutures. Nonapproximated edges.  Wound type:trauma Pressure Injury POA: NA Wound bed:red and moist with nonapproximated edges Drainage (amount, consistency, odor) minimal serosanguinous  No odor Periwound:intact Dressing procedure/placement/frequency:Cleanse left wrist with NS. Apply mupirocin to wound bed.  Cover with vaseline gauze and 4x4.  Secure with kerlix and tape. Change daily. Bedside RN to perform.  Will not follow at this time.  Please re-consult if needed.  Domenic Moras MSN, RN, FNP-BC CWON Wound, Ostomy, Continence Nurse Pager [phone]

## 2018-08-27 NOTE — ED Notes (Signed)
TTS at bedside. 

## 2018-08-28 DIAGNOSIS — R739 Hyperglycemia, unspecified: Secondary | ICD-10-CM

## 2018-08-28 DIAGNOSIS — F332 Major depressive disorder, recurrent severe without psychotic features: Secondary | ICD-10-CM

## 2018-08-28 LAB — BASIC METABOLIC PANEL
Anion gap: 9 (ref 5–15)
BUN: 14 mg/dL (ref 6–20)
CO2: 27 mmol/L (ref 22–32)
Calcium: 8 mg/dL — ABNORMAL LOW (ref 8.9–10.3)
Chloride: 96 mmol/L — ABNORMAL LOW (ref 98–111)
Creatinine, Ser: 0.82 mg/dL (ref 0.61–1.24)
GFR calc Af Amer: >60 mL/min (ref >60)
GFR calc non Af Amer: >60 mL/min (ref >60)
Glucose, Bld: 291 mg/dL — ABNORMAL HIGH (ref 70–99)
Potassium: 3.5 mmol/L (ref 3.5–5.1)
Sodium: 132 mmol/L — ABNORMAL LOW (ref 135–145)

## 2018-08-28 LAB — HEPATITIS PANEL, ACUTE
HCV Ab: <0.1 s/co ratio (ref 0.0–0.9)
Hep A IgM: NEGATIVE
Hep B C IgM: NEGATIVE
Hepatitis B Surface Ag: NEGATIVE

## 2018-08-28 LAB — GLUCOSE, CAPILLARY
GLUCOSE-CAPILLARY: 120 mg/dL — AB (ref 70–99)
Glucose-Capillary: 310 mg/dL — ABNORMAL HIGH (ref 70–99)
Glucose-Capillary: 297 mg/dL — ABNORMAL HIGH (ref 70–99)
Glucose-Capillary: 317 mg/dL — ABNORMAL HIGH (ref 70–99)

## 2018-08-28 MED ORDER — POTASSIUM CHLORIDE CRYS ER 20 MEQ PO TBCR
40.0000 meq | EXTENDED_RELEASE_TABLET | Freq: Once | ORAL | Status: AC
  Administered 2018-08-28: 40 meq via ORAL
  Filled 2018-08-28: qty 2

## 2018-08-28 MED ORDER — INSULIN GLARGINE 100 UNIT/ML ~~LOC~~ SOLN
10.0000 [IU] | Freq: Every day | SUBCUTANEOUS | Status: DC
  Administered 2018-08-28 – 2018-08-30 (×3): 10 [IU] via SUBCUTANEOUS
  Filled 2018-08-28 (×3): qty 0.1

## 2018-08-28 MED ORDER — CEPHALEXIN 500 MG PO CAPS
500.0000 mg | ORAL_CAPSULE | Freq: Three times a day (TID) | ORAL | Status: DC
  Administered 2018-08-28 – 2018-08-30 (×7): 500 mg via ORAL
  Filled 2018-08-28 (×7): qty 1

## 2018-08-28 NOTE — Progress Notes (Signed)
CBG 320 after patient ate a sandwich. Will continue to monitor.

## 2018-08-28 NOTE — Progress Notes (Signed)
[facility] PROGRESS NOTE  Todd Rangel JEH:[id number] DOB: 12/07/1960 DOA: 08/26/2018  PCP: [facility]  Brief History/Interval Summary: 58 y.o. male with medical history significant of alcohol abuse, diabetes mellitus, recently diagnosed brain tumor, hearing loss, who presented with suicidal attempt.  He tried to cut his wrist with a razor.  Patient had also been drinking alcohol quite heavily.  Apparently recently diagnosed with "brain tumor".  This has not been verified yet.  Patient was hospitalized for further management.  Reason for Visit: Suicidal attempt.  Alcohol withdrawal.  Consultants: Psychiatry  Procedures: None  Antibiotics: Was placed on Ancef due to his wrist injury.  Will be changed to Keflex today.  Subjective/Interval History: Patient states that he has been feeling very depressed and anxious lately.  Not very communicative.  Denies any significant pain in the wrist area.  ROS: Denies any shortness of breath.  Objective:  Vital Signs  Vitals:   08/27/18 1947 08/27/18 2334 08/28/18 0532 08/28/18 0918  BP: 107/81 102/70 100/74 103/70  Pulse: 84 95 100 (!) 102  Resp: 16 17 17 20   Temp: 98.3 F (36.8 C) 97.7 F (36.5 C) 98.7 F (37.1 C) 98.5 F (36.9 C)  TempSrc: Oral   Oral  SpO2: 97% 96% 97% 98%  Weight:      Height:        Intake/Output Summary (Last 24 hours) at 08/28/2018 1314 Last data filed at 08/28/2018 1122 Gross per 24 hour  Intake 3136.04 ml  Output 1650 ml  Net 1486.04 ml   Filed Weights   08/27/18 0801  Weight: 104.3 kg    General appearance: alert, cooperative, appears stated age and no distress Head: Normocephalic, without obvious abnormality, atraumatic Resp: clear to auscultation bilaterally Cardio: regular rate and rhythm, S1, S2 normal, no murmur, click, rub or gallop GI: soft, non-tender; bowel sounds normal; no masses,  no organomegaly Extremities: Wounds noted over the left wrist area.  No active  bleeding present.  No clear evidence for infection.  Good radial pulses. Pulses: 2+ and symmetric Neurologic: No focal neurological deficits.  Lab Results:  Data Reviewed: I have personally reviewed following labs and imaging studies  CBC: Recent Labs  Lab 08/26/18 2128 08/27/18 0554  WBC 11.8* 11.0*  NEUTROABS 8.1*  --   HGB 15.1 13.3  HCT 42.9 38.5*  MCV 89.9 89.3  PLT 254 378    Basic Metabolic Panel: Recent Labs  Lab 08/26/18 2128 08/26/18 2318 08/27/18 0554 08/28/18 0557  NA 129*  --  129* 132*  K 2.8*  --  3.8 3.5  CL 79*  --  88* 96*  CO2 29  --  29 27  GLUCOSE 349*  --  231* 291*  BUN 14  --  13 14  CREATININE 0.94  --  0.83 0.82  CALCIUM 8.2*  --  7.4* 8.0*  MG  --  2.6*  --   --     GFR: Estimated Creatinine Clearance: 120.7 mL/min (by C-G formula based on SCr of 0.82 mg/dL).  Liver Function Tests: Recent Labs  Lab 08/26/18 2128  AST 150*  ALT 206*  ALKPHOS 179*  BILITOT 3.2*  PROT 7.1  ALBUMIN 3.9    HbA1C: Recent Labs    08/27/18 0554  HGBA1C 9.9*    CBG: Recent Labs  Lab 08/27/18 1625 08/27/18 1944 08/27/18 2237 08/28/18 0747 08/28/18 1216  GLUCAP 280* 258* 320* 310* 297*      Radiology Studies: Dg Chest 2 View  Result Date: 08/26/2018 CLINICAL DATA:  Suicidal attempt.  ETOH. EXAM: CHEST - 2 VIEW COMPARISON:  None. FINDINGS: The cardiomediastinal silhouette is unremarkable. There is no evidence of focal airspace disease, pulmonary edema, suspicious pulmonary nodule/mass, pleural effusion, or pneumothorax. No acute bony abnormalities are identified. IMPRESSION: No active cardiopulmonary disease. Electronically Signed   By: Margarette Canada M.D.   On: 08/26/2018 21:54   Dg Wrist Complete Left  Result Date: 08/26/2018 CLINICAL DATA:  Laceration to LEFT wrist. EXAM: LEFT WRIST - COMPLETE 3+ VIEW COMPARISON:  None. FINDINGS: No acute fracture, subluxation or dislocation. Mild soft tissue swelling noted. No radiopaque foreign body. No  acute bony abnormalities identified. IMPRESSION: Mild soft tissue swelling without acute bony abnormality or radiopaque foreign body. Electronically Signed   By: Margarette Canada M.D.   On: 08/26/2018 21:52   Ct Head Wo Contrast  Result Date: 08/26/2018 CLINICAL DATA:  Suicidal attempt. Alcohol related disorder. EXAM: CT HEAD WITHOUT CONTRAST TECHNIQUE: Contiguous axial images were obtained from the base of the skull through the vertex without intravenous contrast. COMPARISON:  None. FINDINGS: Brain: Moderate degree of small vessel ischemia in the periventricular, deep and subcortical white matter. No acute intraparenchymal hemorrhage, midline shift or edema. Mild sulcal and ventricular prominence advanced for age. Midline fourth ventricle and basal cisterns without effacement. No definite intra-axial mass nor extra-axial fluid. Vascular: No hyperdense vessel sign. Skull: Normal. Negative for fracture or focal lesion. Sinuses/Orbits: No acute finding. Other: None IMPRESSION: 1. Moderate degree of small vessel ischemia in the periventricular, deep and subcortical white matter. Mild sulcal and ventricular prominence advanced for age. 2. No acute intracranial abnormality. Electronically Signed   By: Ashley Royalty M.D.   On: 08/26/2018 21:49   US Abdomen Limited  Result Date: 08/27/2018 CLINICAL DATA:  58 year old male with elevated LFTs. EXAM: ULTRASOUND ABDOMEN LIMITED RIGHT UPPER QUADRANT COMPARISON:  None. FINDINGS: Gallbladder: Small amount of sludge in the gallbladder. No gallstone, gallbladder wall thickening or pericholecystic fluid. Negative sonographic Murphy's sign. Common bile duct: Not visualized. Liver: There is diffuse increased liver echogenicity most consistent with fatty infiltration. Superimposed fibrosis or inflammation is not excluded. Clinical correlation is recommended. There is liver is enlarged measuring approximately 21 cm in length. Portal vein is patent on color Doppler imaging with normal  direction of blood flow towards the liver. IMPRESSION: 1. Hepatomegaly with fatty infiltration of the liver. 2. No gallstone. Electronically Signed   By: Anner Crete M.D.   On: 08/27/2018 00:38     Medications:  Scheduled: . cephALEXin  500 mg Oral Q8H  . insulin aspart  0-5 Units Subcutaneous QHS  . insulin aspart  0-9 Units Subcutaneous TID WC  . insulin glargine  10 Units Subcutaneous Daily  . LORazepam  0-4 mg Intravenous Q6H   Or  . LORazepam  0-4 mg Oral Q6H  . [START ON 08/29/2018] LORazepam  0-4 mg Intravenous Q12H   Or  . [START ON 08/29/2018] LORazepam  0-4 mg Oral Q12H  . mupirocin ointment   Topical Daily  . pantoprazole  20 mg Oral Daily  . thiamine  100 mg Oral Daily   Continuous: . sodium chloride 50 mL/hr at 08/28/18 1040   XBJ:YNWGNFAOZ, LORazepam, ondansetron, zolpidem    Assessment/Plan:  Suicidal behavior with attempt Continues to have suicidal ideation.  Patient seen by psychiatry patient psychiatric hospitalization.  Patient was hospitalized due to alcohol withdrawal.  Symptoms appear to have improved.  Landscape architect.  Depression Recommendations with psychiatry noted.  These  medications will be initiated.  Alcohol abuse with withdrawal syndrome Vital signs are all stable.  He still very anxious.  Continue CIWA protocol.  Telemetry shows sinus rhythm.  Continue thiamine.  Diabetes mellitus type 2 with hyperglycemia Blood glucose level was significantly elevated.  Unknown what medications patient takes for his diabetes at home.  Unable to locate outside records through care everywhere at this time.  HbA1c 9.9.  Continue Lantus and SSI.  Increase the dose of Lantus to 10 units today.  Abnormal LFTs AST ALT noted to be elevated.  Ultrasound did not show any obvious acute findings.  Fatty infiltration of the liver was noted.  This is most likely due to alcoholism.  Recheck tomorrow morning.  His abdomen is benign.  Hyponatremia and  hypokalemia Potassium level normal this morning.  Sodium has improved.  Magnesium was 2.6.  Open wound of the left wrist Since wound is old it was felt by previous providers that suturing will not be of any benefit.  Wound care.  Patient was empirically placed on Ancef.  No evidence of infection currently.  Change to oral Keflex for a few more days.  Questionable history of "brain tumor" Patient mentions that this was recently diagnosed either at [facility] or at the [facility].  Unfortunately we are unable to pull these records at this time.  CT scan of the head was done which did not show any obvious masses.  No acute findings were noted.  This can be pursued further in the outpatient setting.  Moderate protein calorie malnutrition Encourage oral intake.  DVT Prophylaxis: SCDs    Code Status: Full code Family Communication: Discussed with the patient Disposition Plan: Management as outlined above.  Patient will need to go to behavioral health or other inpatient psychiatric center for further management.  Anticipate he will be medically stable by tomorrow.    LOS: 0 days   Hind Chesler [employer] on [email or web address]  08/28/2018, 1:14 PM

## 2018-08-28 NOTE — Progress Notes (Signed)
PROGRESS NOTE  Todd Rangel CVE:[id number] DOB: 20-Feb-1961 DOA: 08/26/2018 PCP: [facility]  HPI/Recap of past 46 hours: 58 year old male with past history of alcohol abuse diabetes mellitus recent diagnosed with brain tumor hearing loss who presented to the emergency room with suicidal attempt by cutting his left wrist.  Subjective: Patient seen at bedside he denies any complaint.  I informed him that psychiatry was consulted and they recommend admitting him to inpatient psychiatry for help and he was in agreement  Assessment/Plan: Principal Problem:   MDD (major depressive disorder), recurrent severe, without psychosis ([place]) Active Problems:   Alcohol abuse   Brain tumor ([place])   Diabetes mellitus without complication ([place])   Suicidal behavior with attempted self-injury ([place])   Hyponatremia   Abnormal LFTs   Hypokalemia   Open wound of left wrist   Protein-calorie malnutrition, moderate ([place])   1.  Depression with suicidal attempt by cutting her wrists psych consulted and he will be transferred to or admitted to psych inpatient  2.  Hyponatremia mild 129 and he is currently on IV normal saline 3.  Brain tumor  4.  Type 2 diabetes mellitus uncontrolled his sugar was in the 349 on admission.  He was started on Lantus daily and sliding scale  5.  Hypokalemia it was 2.8 on admission it has been repleted.  Is not 3.8   Severity of Illness: The appropriate patient status for this patient is OBSERVATION. Observation status is judged to be reasonable and necessary in order to provide the required intensity of service to ensure the patient's safety. The patient's presenting symptoms, physical exam findings, and initial radiographic and laboratory data in the context of their medical condition is felt to place them at decreased risk for further clinical deterioration. Furthermore, it is anticipated that the patient will be medically stable for discharge from the hospital within 2  midnights of admission. The following factors support the patient status of observation.   " The patient's presenting symptoms include suicidal ideation and attempts. " The physical exam findings include thinning of his wrists. " The initial radiographic and laboratory data are CT scan of the head shows moderate degree of small vessel ischemia. Hepatomegaly with the liver by ultrasound of the right upper quadrant.  He has hyponatremia that is being corrected with IV normal saline      VT ppx: SCD Code Status: Full code Family Communication: None at bed side.   Disposition Plan: Inpatient psychiatry Consults called:   Psychiatry Admission status: OBS/ tele      Objective: Vitals:   08/27/18 1723 08/27/18 1818 08/27/18 1947 08/27/18 2334  BP: 99/64 115/82 107/81 102/70  Pulse: 96 95 84 95  Resp: 17  16 17   Temp: 98.7 F (37.1 C)  98.3 F (36.8 C) 97.7 F (36.5 C)  TempSrc: Oral  Oral   SpO2: 99% 96% 97% 96%  Weight:      Height:        Intake/Output Summary (Last 24 hours) at 08/28/2018 0016 Last data filed at 08/27/2018 2043 Gross per 24 hour  Intake 2060 ml  Output 400 ml  Net 1660 ml   Filed Weights   08/27/18 0801  Weight: 104.3 kg   Body mass index is 32.08 kg/m.  Exam:  . General: 58 y.o. year-old male well developed well nourished in no acute distress.  Alert and oriented x3. . Cardiovascular: Regular rate and rhythm with no rubs or gallops.  No thyromegaly or JVD noted.   Marland Kitchen  Respiratory: Clear to auscultation with no wheezes or rales. Good inspiratory effort. . Abdomen: Soft nontender nondistended with normal bowel sounds x4 quadrants. . Musculoskeletal: No lower extremity edema. 2/4 pulses in all 4 extremities. . Skin: No ulcerative lesions noted or rashes, left wrist dressed up from.  Attempted cutting of himself . Psychiatry: Mood is appropriate for condition and setting.  Affect is flat    Data Reviewed: CBC: Recent Labs  Lab 08/26/18 2128  08/27/18 0554  WBC 11.8* 11.0*  NEUTROABS 8.1*  --   HGB 15.1 13.3  HCT 42.9 38.5*  MCV 89.9 89.3  PLT 254 027   Basic Metabolic Panel: Recent Labs  Lab 08/26/18 2128 08/26/18 2318 08/27/18 0554  NA 129*  --  129*  K 2.8*  --  3.8  CL 79*  --  88*  CO2 29  --  29  GLUCOSE 349*  --  231*  BUN 14  --  13  CREATININE 0.94  --  0.83  CALCIUM 8.2*  --  7.4*  MG  --  2.6*  --    GFR: Estimated Creatinine Clearance: 119.2 mL/min (by C-G formula based on SCr of 0.83 mg/dL). Liver Function Tests: Recent Labs  Lab 08/26/18 2128  AST 150*  ALT 206*  ALKPHOS 179*  BILITOT 3.2*  PROT 7.1  ALBUMIN 3.9   No results for input(s): LIPASE, AMYLASE in the last 168 hours. No results for input(s): AMMONIA in the last 168 hours. Coagulation Profile: No results for input(s): INR, PROTIME in the last 168 hours. Cardiac Enzymes: No results for input(s): CKTOTAL, CKMB, CKMBINDEX, TROPONINI in the last 168 hours. BNP (last 3 results) No results for input(s): PROBNP in the last 8760 hours. HbA1C: Recent Labs    08/27/18 0554  HGBA1C 9.9*   CBG: Recent Labs  Lab 08/27/18 0755 08/27/18 1154 08/27/18 1625 08/27/18 1944 08/27/18 2237  GLUCAP 255* 342* 280* 258* 320*   Lipid Profile: No results for input(s): CHOL, HDL, LDLCALC, TRIG, CHOLHDL, LDLDIRECT in the last 72 hours. Thyroid Function Tests: No results for input(s): TSH, T4TOTAL, FREET4, T3FREE, THYROIDAB in the last 72 hours. Anemia Panel: No results for input(s): VITAMINB12, FOLATE, FERRITIN, TIBC, IRON, RETICCTPCT in the last 72 hours. Urine analysis:    Component Value Date/Time   COLORURINE AMBER (A) 08/26/2018 2349   APPEARANCEUR CLEAR 08/26/2018 2349   LABSPEC 1.030 08/26/2018 2349   PHURINE 6.0 08/26/2018 2349   GLUCOSEU >=500 (A) 08/26/2018 2349   HGBUR SMALL (A) 08/26/2018 2349   BILIRUBINUR NEGATIVE 08/26/2018 2349   KETONESUR 20 (A) 08/26/2018 2349   PROTEINUR 30 (A) 08/26/2018 2349   NITRITE NEGATIVE  08/26/2018 2349   LEUKOCYTESUR NEGATIVE 08/26/2018 2349   Sepsis Labs: @LABRCNTIP (procalcitonin:4,lacticidven:4)  )No results found for this or any previous visit (from the past 240 hour(s)).    Studies: US Abdomen Limited  Result Date: 08/27/2018 CLINICAL DATA:  58 year old male with elevated LFTs. EXAM: ULTRASOUND ABDOMEN LIMITED RIGHT UPPER QUADRANT COMPARISON:  None. FINDINGS: Gallbladder: Small amount of sludge in the gallbladder. No gallstone, gallbladder wall thickening or pericholecystic fluid. Negative sonographic Murphy's sign. Common bile duct: Not visualized. Liver: There is diffuse increased liver echogenicity most consistent with fatty infiltration. Superimposed fibrosis or inflammation is not excluded. Clinical correlation is recommended. There is liver is enlarged measuring approximately 21 cm in length. Portal vein is patent on color Doppler imaging with normal direction of blood flow towards the liver. IMPRESSION: 1. Hepatomegaly with fatty infiltration of the liver. 2.  No gallstone. Electronically Signed   By: Anner Crete M.D.   On: 08/27/2018 00:38    Scheduled Meds: . insulin aspart  0-5 Units Subcutaneous QHS  . insulin aspart  0-9 Units Subcutaneous TID WC  . insulin glargine  5 Units Subcutaneous Daily  . LORazepam  0-4 mg Intravenous Q6H   Or  . LORazepam  0-4 mg Oral Q6H  . [START ON 08/29/2018] LORazepam  0-4 mg Intravenous Q12H   Or  . [START ON 08/29/2018] LORazepam  0-4 mg Oral Q12H  . mupirocin ointment   Topical Daily  . pantoprazole  20 mg Oral Daily  . thiamine  100 mg Oral Daily   Or  . thiamine  100 mg Intravenous Daily    Continuous Infusions: . sodium chloride 125 mL/hr at 08/27/18 1700  .  ceFAZolin (ANCEF) IV 1 g (08/27/18 2236)     LOS: 0 days     Cristal Deer, MD [facility]  To reach me or the doctor on call, go to: [email or web address] Password [facility]  08/28/2018, 12:16 AM

## 2018-08-29 LAB — COMPREHENSIVE METABOLIC PANEL
ALT: 142 U/L — ABNORMAL HIGH (ref 0–44)
AST: 117 U/L — ABNORMAL HIGH (ref 15–41)
Albumin: 2.8 g/dL — ABNORMAL LOW (ref 3.5–5.0)
Alkaline Phosphatase: 150 U/L — ABNORMAL HIGH (ref 38–126)
Anion gap: 7 (ref 5–15)
BUN: 10 mg/dL (ref 6–20)
CO2: 25 mmol/L (ref 22–32)
CREATININE: 0.75 mg/dL (ref 0.61–1.24)
Calcium: 7.9 mg/dL — ABNORMAL LOW (ref 8.9–10.3)
Chloride: 104 mmol/L (ref 98–111)
GFR calc Af Amer: >60 mL/min (ref >60)
GFR calc non Af Amer: >60 mL/min (ref >60)
Glucose, Bld: 248 mg/dL — ABNORMAL HIGH (ref 70–99)
Potassium: 3.8 mmol/L (ref 3.5–5.1)
Sodium: 136 mmol/L (ref 135–145)
Total Bilirubin: 1.4 mg/dL — ABNORMAL HIGH (ref 0.3–1.2)
Total Protein: 5 g/dL — ABNORMAL LOW (ref 6.5–8.1)

## 2018-08-29 LAB — CBC
HCT: 32.2 % — ABNORMAL LOW (ref 39.0–52.0)
Hemoglobin: 10.8 g/dL — ABNORMAL LOW (ref 13.0–17.0)
MCH: 31.8 pg (ref 26.0–34.0)
MCHC: 33.5 g/dL (ref 30.0–36.0)
MCV: 94.7 fL (ref 80.0–100.0)
Platelets: 128 10*3/uL — ABNORMAL LOW (ref 150–400)
RBC: 3.4 MIL/uL — ABNORMAL LOW (ref 4.22–5.81)
RDW: 13.5 % (ref 11.5–15.5)
WBC: 5 10*3/uL (ref 4.0–10.5)
nRBC: 0 % (ref 0.0–0.2)

## 2018-08-29 LAB — GLUCOSE, CAPILLARY
GLUCOSE-CAPILLARY: 231 mg/dL — AB (ref 70–99)
GLUCOSE-CAPILLARY: 288 mg/dL — AB (ref 70–99)
GLUCOSE-CAPILLARY: 261 mg/dL — AB (ref 70–99)
Glucose-Capillary: 272 mg/dL — ABNORMAL HIGH (ref 70–99)

## 2018-08-29 LAB — MAGNESIUM: Magnesium: 1.8 mg/dL (ref 1.7–2.4)

## 2018-08-29 MED ORDER — ADULT MULTIVITAMIN W/MINERALS CH
1.0000 | ORAL_TABLET | Freq: Every day | ORAL | Status: DC
  Administered 2018-08-29 – 2018-08-30 (×2): 1 via ORAL
  Filled 2018-08-29 (×2): qty 1

## 2018-08-29 MED ORDER — SERTRALINE HCL 50 MG PO TABS
50.0000 mg | ORAL_TABLET | Freq: Every day | ORAL | Status: DC
  Administered 2018-08-29 – 2018-08-30 (×2): 50 mg via ORAL
  Filled 2018-08-29 (×2): qty 1

## 2018-08-29 MED ORDER — ENSURE MAX PROTEIN PO LIQD
11.0000 [oz_av] | Freq: Every day | ORAL | Status: DC
  Administered 2018-08-29 – 2018-08-30 (×2): 11 [oz_av] via ORAL
  Filled 2018-08-29 (×2): qty 330

## 2018-08-29 MED ORDER — TRAZODONE HCL 50 MG PO TABS: 50.0000 mg | ORAL_TABLET | Freq: Every day | ORAL | Status: DC

## 2018-08-29 NOTE — Progress Notes (Signed)
[facility] PROGRESS NOTE  Todd Rangel UKG:[id number] DOB: 10/28/1960 DOA: 08/26/2018  PCP: [facility]  Brief History/Interval Summary: 58 y.o. male with medical history significant of alcohol abuse, diabetes mellitus, recently diagnosed brain tumor, hearing loss, who presented with suicidal attempt.  He tried to cut his wrist with a razor.  Patient had also been drinking alcohol quite heavily.  Apparently recently diagnosed with "brain tumor".  This has not been verified yet.  Patient was hospitalized for further management.  Reason for Visit: Suicidal attempt.  Alcohol withdrawal.  Consultants: Psychiatry  Procedures: None  Antibiotics: Was placed on Ancef due to his wrist injury.  Changed over to Keflex  Subjective/Interval History: Patient not very communicative today.  Answers questions in monosyllables.  Apparently very unsteady on his feet per nursing staff.  Patient denies any complaints.  ROS: Denies any shortness of breath  Objective:  Vital Signs  Vitals:   08/28/18 1319 08/28/18 1600 08/28/18 2335 08/29/18 0604  BP: 104/72 113/79 109/74 119/86  Pulse: (!) 102 (!) 105 (!) 106 94  Resp: 20 19 18 18   Temp: 97.9 F (36.6 C) 98.3 F (36.8 C) 98.6 F (37 C) 98.2 F (36.8 C)  TempSrc:      SpO2: 98% 98% 97% 97%  Weight:      Height:        Intake/Output Summary (Last 24 hours) at 08/29/2018 0928 Last data filed at 08/29/2018 0730 Gross per 24 hour  Intake 560 ml  Output 3175 ml  Net -2615 ml   Filed Weights   08/27/18 0801  Weight: 104.3 kg   General appearance: Awake alert.  In no distress Resp: Clear to auscultation bilaterally.  Normal effort Cardio: S1-S2 is mildly tachycardic.  Regular.  No S3-S4.  No rubs murmurs or bruit.   GI: Abdomen is soft.  Nontender nondistended.  Bowel sounds are present normal.  No masses organomegaly Extremities: Left wrist area covered with dressing.  Dressing not removed.  Wounds were noted yesterday.   Good radial pulses. Neurologic: No focal neurological deficits.   Lab Results:  Data Reviewed: I have personally reviewed following labs and imaging studies  CBC: Recent Labs  Lab 08/26/18 2128 08/27/18 0554 08/29/18 0527  WBC 11.8* 11.0* 5.0  NEUTROABS 8.1*  --   --   HGB 15.1 13.3 10.8*  HCT 42.9 38.5* 32.2*  MCV 89.9 89.3 94.7  PLT 254 196 128*    Basic Metabolic Panel: Recent Labs  Lab 08/26/18 2128 08/26/18 2318 08/27/18 0554 08/28/18 0557 08/29/18 0527  NA 129*  --  129* 132* 136  K 2.8*  --  3.8 3.5 3.8  CL 79*  --  88* 96* 104  CO2 29  --  29 27 25   GLUCOSE 349*  --  231* 291* 248*  BUN 14  --  13 14 10   CREATININE 0.94  --  0.83 0.82 0.75  CALCIUM 8.2*  --  7.4* 8.0* 7.9*  MG  --  2.6*  --   --  1.8    GFR: Estimated Creatinine Clearance: 123.7 mL/min (by C-G formula based on SCr of 0.75 mg/dL).  Liver Function Tests: Recent Labs  Lab 08/26/18 2128 08/29/18 0527  AST 150* 117*  ALT 206* 142*  ALKPHOS 179* 150*  BILITOT 3.2* 1.4*  PROT 7.1 5.0*  ALBUMIN 3.9 2.8*    HbA1C: Recent Labs    08/27/18 0554  HGBA1C 9.9*    CBG: Recent Labs  Lab 08/28/18 0747  08/28/18 1216 08/28/18 1539 08/28/18 2005 08/29/18 0750  GLUCAP 310* 297* 317* 120* 231*      Radiology Studies: No results found.   Medications:  Scheduled: . cephALEXin  500 mg Oral Q8H  . insulin aspart  0-5 Units Subcutaneous QHS  . insulin aspart  0-9 Units Subcutaneous TID WC  . insulin glargine  10 Units Subcutaneous Daily  . LORazepam  0-4 mg Intravenous Q12H   Or  . LORazepam  0-4 mg Oral Q12H  . mupirocin ointment   Topical Daily  . pantoprazole  20 mg Oral Daily  . thiamine  100 mg Oral Daily   Continuous: . sodium chloride 50 mL/hr at 08/29/18 0647   MBW:[id number], ondansetron    Assessment/Plan:  Suicidal behavior with attempt Continues to have suicidal ideation.  Patient seen by psychiatry.  Plan is for inpatient psychiatric treatment.  Symptoms  are stable.  Landscape architect.   Depression Psychiatry recommendations noted.  We will start him on Zoloft and trazodone.  Hold off on gabapentin for now.  Alcohol abuse with withdrawal syndrome Withdrawal symptoms are stable.  Not very tachycardic.  Continue CIWA protocol.  Continue thiamine.    Diabetes mellitus type 2 with hyperglycemia Blood glucose level was significantly elevated.  Unknown what medications patient takes for his diabetes at home.  Unable to locate outside records through care everywhere at this time.  HbA1c 9.9.  Patient's dose of Lantus was increased yesterday.  CBGs not yet optimally controlled.  We will see how the next 24 hours goes and then changes dose tomorrow.  Continue SSI.    Abnormal LFTs likely due to alcoholic hepatitis Patient's AST ALT and bilirubin were elevated.  Slowly trending down.  Continue to monitor.  Ultrasound did not show any obvious acute findings.  Fatty infiltration of the liver was noted.  Abdomen is benign.  Hyponatremia and hypokalemia Lites have improved and stable.  Open wound of the left wrist Since wound is old it was felt by previous providers that suturing will not be of any benefit.  Wound care.  Patient was empirically placed on Ancef.  No evidence of infection currently.  Changed to oral Keflex for a few more days.  Questionable history of "brain tumor" Patient mentions that this was recently diagnosed either at [facility] or at the [facility].  Unfortunately we are unable to pull these records at this time.  CT scan of the head was done which did not show any obvious masses.  No acute findings were noted.  This can be pursued further in the outpatient setting.  Moderate protein calorie malnutrition Encourage oral intake.  Unsteady gait Likely due to deconditioning.  PT evaluation.  Normocytic anemia Drop in hemoglobin is dilutional.  No overt bleeding noted.  Recheck tomorrow.  DVT Prophylaxis: SCDs    Code Status:  Full code Family Communication: Discussed with the patient Disposition Plan: Management as outlined above.  Patient will need to go to behavioral health or other inpatient psychiatric center for further management.  Patient is now considered to be medically ready for transfer/discharge to inpatient psychiatric facility.    LOS: 1 day   Sony Schlarb [employer] on [email or web address]  08/29/2018, 9:28 AM

## 2018-08-29 NOTE — Progress Notes (Signed)
Initial Nutrition Assessment  DOCUMENTATION CODES:   Obesity unspecified  INTERVENTION:   -Provide Ensure Max daily, provides 150 kcal and 30g protein -Provide Multivitamin with minerals daily  NUTRITION DIAGNOSIS:   Increased nutrient needs related to (ETOH abuse) as evidenced by estimated needs.  GOAL:   Patient will meet greater than or equal to 90% of their needs  MONITOR:   PO intake, Supplement acceptance, Labs, Weight trends, I & O's  REASON FOR ASSESSMENT:   Consult Assessment of nutrition requirement/status  ASSESSMENT:   58 y.o. male with medical history significant of alcohol abuse, diabetes mellitus, recently diagnosed brain tumor, hearing loss, who presented with suicidal attempt.  Patient currently consuming 100% of meals. Pt reports no changes in appetite and no weight changes. Pt has been drinking ETOH daily since Christmas time. Averaging ~1/2 gallon of vodka daily. Per psych notes, inpatient psych is recommended once medically stable. RD to order Ensure Max daily to provide additional protein to diet given increased needs from substance abuse. Will order daily MVI as well.   No weight records available in chart.  Medications: Thiamine tablet daily  Labs reviewed: CBGs: 231-272   NUTRITION - FOCUSED PHYSICAL EXAM:  Nutrition focused physical exam shows no sign of depletion of muscle mass or body fat.  Diet Order:   Diet Order            Diet Carb Modified Fluid consistency: Thin; Room service appropriate? Yes  Diet effective now              EDUCATION NEEDS:   No education needs have been identified at this time  Skin:  Skin Assessment: Reviewed RN Assessment  Last BM:  1/25  Height:   Ht Readings from Last 1 Encounters:  08/27/18 5\' 11"  (1.803 m)    Weight:   Wt Readings from Last 1 Encounters:  08/27/18 104.3 kg    Ideal Body Weight:  78.2 kg  BMI:  Body mass index is 32.08 kg/m.  Estimated Nutritional Needs:   Kcal:   2000-2200  Protein:  85-95g  Fluid:  2L/day  Todd Bibles, MS, RD, LDN [place] Dietitian Pager: [phone] After Hours Pager: [phone]

## 2018-08-30 ENCOUNTER — Other Ambulatory Visit: Payer: Self-pay

## 2018-08-30 ENCOUNTER — Inpatient Hospital Stay
Admission: AD | Admit: 2018-08-30 | Discharge: 2018-09-05 | DRG: 885 | Disposition: A | Discharge status: Home / Self Care | Payer: No Typology Code available for payment source | Source: Intra-hospital | Attending: Psychiatry | Admitting: Psychiatry

## 2018-08-30 ENCOUNTER — Encounter: Payer: Self-pay | Admitting: Psychiatry

## 2018-08-30 DIAGNOSIS — Z6830 Body mass index (BMI) 30.0-30.9, adult: Secondary | ICD-10-CM | POA: Diagnosis not present

## 2018-08-30 DIAGNOSIS — Z818 Family history of other mental and behavioral disorders: Secondary | ICD-10-CM | POA: Diagnosis not present

## 2018-08-30 DIAGNOSIS — Z8669 Personal history of other diseases of the nervous system and sense organs: Secondary | ICD-10-CM

## 2018-08-30 DIAGNOSIS — E119 Type 2 diabetes mellitus without complications: Secondary | ICD-10-CM

## 2018-08-30 DIAGNOSIS — Z87891 Personal history of nicotine dependence: Secondary | ICD-10-CM

## 2018-08-30 DIAGNOSIS — Z590 Homelessness: Secondary | ICD-10-CM | POA: Diagnosis not present

## 2018-08-30 DIAGNOSIS — Z794 Long term (current) use of insulin: Secondary | ICD-10-CM | POA: Diagnosis not present

## 2018-08-30 DIAGNOSIS — Z981 Arthrodesis status: Secondary | ICD-10-CM | POA: Diagnosis not present

## 2018-08-30 DIAGNOSIS — F332 Major depressive disorder, recurrent severe without psychotic features: Principal | ICD-10-CM | POA: Diagnosis present

## 2018-08-30 DIAGNOSIS — F1020 Alcohol dependence, uncomplicated: Secondary | ICD-10-CM | POA: Diagnosis present

## 2018-08-30 DIAGNOSIS — H9190 Unspecified hearing loss, unspecified ear: Secondary | ICD-10-CM | POA: Diagnosis present

## 2018-08-30 DIAGNOSIS — Z801 Family history of malignant neoplasm of trachea, bronchus and lung: Secondary | ICD-10-CM

## 2018-08-30 DIAGNOSIS — Z915 Personal history of self-harm: Secondary | ICD-10-CM | POA: Diagnosis not present

## 2018-08-30 DIAGNOSIS — E440 Moderate protein-calorie malnutrition: Secondary | ICD-10-CM | POA: Diagnosis present

## 2018-08-30 DIAGNOSIS — F419 Anxiety disorder, unspecified: Secondary | ICD-10-CM | POA: Diagnosis present

## 2018-08-30 DIAGNOSIS — K219 Gastro-esophageal reflux disease without esophagitis: Secondary | ICD-10-CM | POA: Diagnosis present

## 2018-08-30 DIAGNOSIS — Z800 Family history of malignant neoplasm of digestive organs: Secondary | ICD-10-CM

## 2018-08-30 DIAGNOSIS — R45851 Suicidal ideations: Secondary | ICD-10-CM | POA: Diagnosis present

## 2018-08-30 DIAGNOSIS — T1491XA Suicide attempt, initial encounter: Secondary | ICD-10-CM | POA: Diagnosis present

## 2018-08-30 DIAGNOSIS — Z79899 Other long term (current) drug therapy: Secondary | ICD-10-CM

## 2018-08-30 DIAGNOSIS — S61502A Unspecified open wound of left wrist, initial encounter: Secondary | ICD-10-CM | POA: Diagnosis present

## 2018-08-30 LAB — COMPREHENSIVE METABOLIC PANEL
ALBUMIN: 2.8 g/dL — AB (ref 3.5–5.0)
ALT: 126 U/L — ABNORMAL HIGH (ref 0–44)
ANION GAP: 7 (ref 5–15)
AST: 91 U/L — ABNORMAL HIGH (ref 15–41)
Alkaline Phosphatase: 147 U/L — ABNORMAL HIGH (ref 38–126)
BUN: 10 mg/dL (ref 6–20)
CO2: 24 mmol/L (ref 22–32)
CREATININE: 0.7 mg/dL (ref 0.61–1.24)
Calcium: 8.2 mg/dL — ABNORMAL LOW (ref 8.9–10.3)
Chloride: 104 mmol/L (ref 98–111)
GFR calc Af Amer: >60 mL/min (ref >60)
GFR calc non Af Amer: >60 mL/min (ref >60)
GLUCOSE: 288 mg/dL — AB (ref 70–99)
Potassium: 3.6 mmol/L (ref 3.5–5.1)
Sodium: 135 mmol/L (ref 135–145)
Total Bilirubin: 1.1 mg/dL (ref 0.3–1.2)
Total Protein: 4.9 g/dL — ABNORMAL LOW (ref 6.5–8.1)

## 2018-08-30 LAB — GLUCOSE, CAPILLARY
GLUCOSE-CAPILLARY: 266 mg/dL — AB (ref 70–99)
Glucose-Capillary: 356 mg/dL — ABNORMAL HIGH (ref 70–99)
Glucose-Capillary: 243 mg/dL — ABNORMAL HIGH (ref 70–99)
Glucose-Capillary: 347 mg/dL — ABNORMAL HIGH (ref 70–99)

## 2018-08-30 LAB — CBC
HCT: 32.3 % — ABNORMAL LOW (ref 39.0–52.0)
Hemoglobin: 10.5 g/dL — ABNORMAL LOW (ref 13.0–17.0)
MCH: 31.3 pg (ref 26.0–34.0)
MCHC: 32.5 g/dL (ref 30.0–36.0)
MCV: 96.1 fL (ref 80.0–100.0)
Platelets: 117 10*3/uL — ABNORMAL LOW (ref 150–400)
RBC: 3.36 MIL/uL — ABNORMAL LOW (ref 4.22–5.81)
RDW: 13.7 % (ref 11.5–15.5)
WBC: 4.6 10*3/uL (ref 4.0–10.5)
nRBC: 0 % (ref 0.0–0.2)

## 2018-08-30 LAB — MAGNESIUM: Magnesium: 1.8 mg/dL (ref 1.7–2.4)

## 2018-08-30 MED ORDER — INSULIN ASPART 100 UNIT/ML ~~LOC~~ SOLN: 0.0000 [IU] | Freq: Every day | SUBCUTANEOUS | 11 refills | Status: DC

## 2018-08-30 MED ORDER — THIAMINE HCL 100 MG PO TABS: 100.0000 mg | ORAL_TABLET | Freq: Every day | ORAL | Status: DC

## 2018-08-30 MED ORDER — INSULIN GLARGINE 100 UNIT/ML ~~LOC~~ SOLN: 15.0000 [IU] | Freq: Every day | SUBCUTANEOUS | 11 refills | Status: DC

## 2018-08-30 MED ORDER — ALUM & MAG HYDROXIDE-SIMETH 200-200-20 MG/5ML PO SUSP: 30.0000 mL | ORAL | Status: DC | PRN

## 2018-08-30 MED ORDER — SERTRALINE HCL 50 MG PO TABS: 50.0000 mg | ORAL_TABLET | Freq: Every day | ORAL | Status: DC

## 2018-08-30 MED ORDER — PANTOPRAZOLE SODIUM 20 MG PO TBEC: 20.0000 mg | DELAYED_RELEASE_TABLET | Freq: Every day | ORAL | Status: DC

## 2018-08-30 MED ORDER — INSULIN GLARGINE 100 UNIT/ML ~~LOC~~ SOLN: 15.0000 [IU] | Freq: Every day | SUBCUTANEOUS | Status: DC

## 2018-08-30 MED ORDER — HYDROXYZINE HCL 25 MG PO TABS: 25.0000 mg | ORAL_TABLET | ORAL | Status: DC | PRN

## 2018-08-30 MED ORDER — ADULT MULTIVITAMIN W/MINERALS CH
1.0000 | ORAL_TABLET | Freq: Every day | ORAL | Status: DC
  Administered 2018-08-31 – 2018-09-05 (×6): 1 via ORAL
  Filled 2018-08-30 (×6): qty 1

## 2018-08-30 MED ORDER — ADULT MULTIVITAMIN W/MINERALS CH: 1.0000 | ORAL_TABLET | Freq: Every day | ORAL | Status: DC

## 2018-08-30 MED ORDER — TRAZODONE HCL 50 MG PO TABS: 50.0000 mg | ORAL_TABLET | Freq: Every day | ORAL | Status: DC

## 2018-08-30 MED ORDER — GLUCERNA SHAKE PO LIQD
237.0000 mL | Freq: Three times a day (TID) | ORAL | Status: DC
  Administered 2018-08-30 – 2018-09-05 (×17): 237 mL via ORAL

## 2018-08-30 MED ORDER — PANTOPRAZOLE SODIUM 20 MG PO TBEC
20.0000 mg | DELAYED_RELEASE_TABLET | Freq: Every day | ORAL | Status: DC
  Administered 2018-08-31 – 2018-09-05 (×6): 20 mg via ORAL
  Filled 2018-08-30 (×6): qty 1

## 2018-08-30 MED ORDER — VITAMIN B-1 100 MG PO TABS
100.0000 mg | ORAL_TABLET | Freq: Every day | ORAL | Status: DC
  Administered 2018-08-31 – 2018-09-05 (×6): 100 mg via ORAL
  Filled 2018-08-30 (×6): qty 1

## 2018-08-30 MED ORDER — TRAZODONE HCL 50 MG PO TABS
50.0000 mg | ORAL_TABLET | Freq: Every day | ORAL | Status: DC
  Administered 2018-08-30 – 2018-09-01 (×3): 50 mg via ORAL
  Filled 2018-08-30 (×4): qty 1

## 2018-08-30 MED ORDER — CEPHALEXIN 500 MG PO CAPS: 500.0000 mg | ORAL_CAPSULE | Freq: Three times a day (TID) | ORAL | Status: DC

## 2018-08-30 MED ORDER — ACETAMINOPHEN 325 MG PO TABS: 650.0000 mg | ORAL_TABLET | Freq: Four times a day (QID) | ORAL | Status: DC | PRN

## 2018-08-30 MED ORDER — GABAPENTIN 300 MG PO CAPS
300.0000 mg | ORAL_CAPSULE | Freq: Three times a day (TID) | ORAL | Status: DC
  Administered 2018-08-30 – 2018-08-31 (×4): 300 mg via ORAL
  Filled 2018-08-30 (×4): qty 1

## 2018-08-30 MED ORDER — MUPIROCIN 2 % EX OINT
TOPICAL_OINTMENT | Freq: Every day | CUTANEOUS | Status: DC
  Administered 2018-08-31: 1 via TOPICAL
  Administered 2018-09-01 – 2018-09-02 (×2): via TOPICAL
  Administered 2018-09-03: 1 via TOPICAL
  Administered 2018-09-04 – 2018-09-05 (×2): via TOPICAL
  Filled 2018-08-30: qty 22

## 2018-08-30 MED ORDER — INSULIN ASPART 100 UNIT/ML ~~LOC~~ SOLN
0.0000 [IU] | Freq: Three times a day (TID) | SUBCUTANEOUS | Status: DC
  Administered 2018-08-30 – 2018-08-31 (×2): 3 [IU] via SUBCUTANEOUS
  Filled 2018-08-30: qty 1

## 2018-08-30 MED ORDER — LORAZEPAM 2 MG PO TABS: 0.0000 mg | ORAL_TABLET | Freq: Two times a day (BID) | ORAL | 0 refills | Status: DC

## 2018-08-30 MED ORDER — ONDANSETRON HCL 4 MG/2ML IJ SOLN
4.0000 mg | Freq: Three times a day (TID) | INTRAMUSCULAR | Status: DC | PRN
  Filled 2018-08-30: qty 2

## 2018-08-30 MED ORDER — CEPHALEXIN 500 MG PO CAPS
500.0000 mg | ORAL_CAPSULE | Freq: Three times a day (TID) | ORAL | Status: AC
  Administered 2018-08-30 – 2018-09-01 (×5): 500 mg via ORAL
  Filled 2018-08-30 (×5): qty 1

## 2018-08-30 MED ORDER — MAGNESIUM HYDROXIDE 400 MG/5ML PO SUSP: 30.0000 mL | Freq: Every day | ORAL | Status: DC | PRN

## 2018-08-30 MED ORDER — INSULIN ASPART 100 UNIT/ML ~~LOC~~ SOLN: 0.0000 [IU] | Freq: Every day | SUBCUTANEOUS | Status: DC

## 2018-08-30 MED ORDER — ENSURE MAX PROTEIN PO LIQD
11.0000 [oz_av] | Freq: Every day | ORAL | Status: DC
  Filled 2018-08-30: qty 330

## 2018-08-30 MED ORDER — INSULIN GLARGINE 100 UNIT/ML ~~LOC~~ SOLN
15.0000 [IU] | Freq: Every day | SUBCUTANEOUS | Status: DC
  Administered 2018-08-30 – 2018-08-31 (×2): 15 [IU] via SUBCUTANEOUS
  Filled 2018-08-30 (×2): qty 0.15

## 2018-08-30 MED ORDER — INSULIN GLARGINE 100 UNIT/ML ~~LOC~~ SOLN
5.0000 [IU] | Freq: Once | SUBCUTANEOUS | Status: AC
  Administered 2018-08-30: 5 [IU] via SUBCUTANEOUS
  Filled 2018-08-30: qty 0.05

## 2018-08-30 MED ORDER — INSULIN ASPART 100 UNIT/ML ~~LOC~~ SOLN: 0.0000 [IU] | Freq: Three times a day (TID) | SUBCUTANEOUS | 11 refills | Status: DC

## 2018-08-30 MED ORDER — INSULIN ASPART 100 UNIT/ML ~~LOC~~ SOLN: 0.0000 [IU] | Freq: Three times a day (TID) | SUBCUTANEOUS | Status: DC

## 2018-08-30 MED ORDER — SERTRALINE HCL 25 MG PO TABS
50.0000 mg | ORAL_TABLET | Freq: Every day | ORAL | Status: DC
  Administered 2018-08-31: 50 mg via ORAL
  Filled 2018-08-30: qty 2

## 2018-08-30 NOTE — Progress Notes (Signed)
Inpatient Diabetes Program Recommendations  AACE/ADA: New Consensus Statement on Inpatient Glycemic Control (2015)  Target Ranges:  Prepandial:   less than 140 mg/dL      Peak postprandial:   less than 180 mg/dL (1-2 hours)      Critically ill patients:  140 - 180 mg/dL   Lab Results  Component Value Date   GLUCAP 266 (H) 08/30/2018   HGBA1C 9.9 (H) 08/27/2018    Review of Glycemic Control  Diabetes history: DM2 Outpatient Diabetes medications: N/A Current orders for Inpatient glycemic control: Lantus 10 units QD, Novolog 0-9 units tidwc and hs.  HgbA1C 9.9% - uncontrolled  Inpatient Diabetes Program Recommendations:     Increase Lantus to 15 units QD Add Novolog 4 units tidwc for meal coverage insulin if pt eats > 50% meal.  Will continue to follow.  Thank you. Lorenda Peck, RD, LDN, CDE Inpatient Diabetes Coordinator [phone]

## 2018-08-30 NOTE — BH Assessment (Signed)
Patient has been accepted to [facility].  Accepting physician is Dr. Leverne Humbles.  Attending Physician will be Dr. Bary Leriche.  Patient has been assigned to room 302, by [place] F.  Call report to [phone].  Representative/Transfer Coordinator is Dispensing optician Patient pre-admitted by [facility] Patient Access Edison Nasuti)  [facility] Staff Quincy Sheehan, Social Worker) made aware of acceptance.

## 2018-08-30 NOTE — Progress Notes (Signed)
Patient to transfer to [facility].   LCSW set up transport with [employer].   LCSW attempted to notify Tillie Rung, patients daughter. No answer and no option to leave voicemail.   Carolin Coy [place]

## 2018-08-30 NOTE — Discharge Summary (Signed)
[facility]  Physician Discharge Summary   Patient ID: Todd Rangel MRN: [id number] DOB/AGE: 59-Oct-1962 59 y.o.  Admit date: 08/26/2018 Discharge date: 08/30/2018  PCP: [place]:  Suicidal attempt Depression Alcohol abuse with withdrawal, improved Diabetes mellitus type 2 with hyperglycemia Abnormal LFTs likely secondary to alcoholic hepatitis, improving Self inflicted wound of the left wrist Questionable history of "brain tumor" Normocytic anemia   RECOMMENDATIONS FOR OUTPATIENT FOLLOW UP: 1. Patient being discharged/transferred to [facility] 2. Monitor CBGs.  Sliding scale insulin coverage.  Lantus dose increased today. 3. Will need dressing changes to the left wrist twice a day with a pressure dressing.   DISCHARGE CONDITION: fair  Diet recommendation: Modified carbohydrate  Filed Weights   08/27/18 0801  Weight: 104.3 kg    INITIAL HISTORY: 58 y.o.malewith medical history significant ofalcohol abuse, diabetes mellitus, recently diagnosed brain tumor, hearing loss, who presented with suicidal attempt.  He tried to cut his wrist with a razor.  Patient had also been drinking alcohol quite heavily.  Apparently recently diagnosed with "brain tumor".  This has not been verified yet.  Patient was hospitalized for further management.  Consultations:  Psychiatry  Procedures:  None   HOSPITAL COURSE:   Suicidal behavior with attempt Continues to have suicidal ideation.  Patient seen by psychiatry.  Plan is for inpatient psychiatric treatment.  Symptoms are stable.  Landscape architect.   Depression Psychiatry recommendations noted.    Patient was started on Zoloft and trazodone.    Alcohol abuse with withdrawal syndrome Patient was placed on CIWA protocol.  His symptoms have improved.  Continue thiamine multivitamins.    Diabetes mellitus type 2 with hyperglycemia Blood glucose level was significantly  elevated.  Unknown what medications patient takes for his diabetes at home.  Unable to locate outside records through care everywhere at this time.  HbA1c 9.9.    Patient's dose of Lantus is being titrated upwards.  Will be increased to 15 units today.  Monitor CBGs.  Continue SSI.  Change to moderate sliding scale.     Abnormal LFTs likely due to alcoholic hepatitis Patient's AST ALT and bilirubin were elevated.  Slowly trending down. Ultrasound did not show any obvious acute findings.  Fatty infiltration of the liver was noted.  Abdomen is benign.  Hyponatremia and hypokalemia Electrolytes have improved and stable.  Open wound of the left wrist Since wound is old it was felt by previous providers that suturing will not be of any benefit.    Is noted.  Pressure dressing to be utilized.  Good radial pulses.  He was initially empirically placed on Ancef.  Changed to Keflex which can be given for 1 more day.  He will need dressing changes twice a day.  Steri-Strips can be used.  Patient was given a booster dose of Tdap   Questionable history of "brain tumor" Patient mentions that this was recently diagnosed either at [facility] or at the [facility].  Unfortunately we are unable to pull these records at this time.  CT scan of the head was done which did not show any obvious masses.  No acute findings were noted.  This can be pursued further in the outpatient setting.  Moderate protein calorie malnutrition Encourage oral intake.  Unsteady gait Likely due to deconditioning.    Will need assistance.  PT eval was ordered however patient did not cooperate with them.  Normocytic anemia Drop in hemoglobin is dilutional.    Hemoglobin  is stable this morning.  Dyspnea likely secondary to anxiety Patient mentioned that he was feeling shortness of breath but he attributes this to anxiety.  Patient's lungs are clear to auscultation bilaterally.  He saturating normal on room air.  His other vital  signs are stable.  Overall stable.  Okay for discharge to behavioral health today.     PERTINENT LABS:  The results of significant diagnostics from this hospitalization (including imaging, microbiology, ancillary and laboratory) are listed below for reference.     Labs: Basic Metabolic Panel: Recent Labs  Lab 08/26/18 2128 08/26/18 2318 08/27/18 0554 08/28/18 0557 08/29/18 0527 08/30/18 0538  NA 129*  --  129* 132* 136 135  K 2.8*  --  3.8 3.5 3.8 3.6  CL 79*  --  88* 96* 104 104  CO2 29  --  29 27 25 24   GLUCOSE 349*  --  231* 291* 248* 288*  BUN 14  --  13 14 10 10   CREATININE 0.94  --  0.83 0.82 0.75 0.70  CALCIUM 8.2*  --  7.4* 8.0* 7.9* 8.2*  MG  --  2.6*  --   --  1.8 1.8   Liver Function Tests: Recent Labs  Lab 08/26/18 2128 08/29/18 0527 08/30/18 0538  AST 150* 117* 91*  ALT 206* 142* 126*  ALKPHOS 179* 150* 147*  BILITOT 3.2* 1.4* 1.1  PROT 7.1 5.0* 4.9*  ALBUMIN 3.9 2.8* 2.8*   CBC: Recent Labs  Lab 08/26/18 2128 08/27/18 0554 08/29/18 0527 08/30/18 0538  WBC 11.8* 11.0* 5.0 4.6  NEUTROABS 8.1*  --   --   --   HGB 15.1 13.3 10.8* 10.5*  HCT 42.9 38.5* 32.2* 32.3*  MCV 89.9 89.3 94.7 96.1  PLT 254 196 128* 117*    CBG: Recent Labs  Lab 08/29/18 1143 08/29/18 1654 08/29/18 2049 08/30/18 0737 08/30/18 1148  GLUCAP 272* 288* 261* 266* 356*     IMAGING STUDIES Dg Chest 2 View  Result Date: 08/26/2018 CLINICAL DATA:  Suicidal attempt.  ETOH. EXAM: CHEST - 2 VIEW COMPARISON:  None. FINDINGS: The cardiomediastinal silhouette is unremarkable. There is no evidence of focal airspace disease, pulmonary edema, suspicious pulmonary nodule/mass, pleural effusion, or pneumothorax. No acute bony abnormalities are identified. IMPRESSION: No active cardiopulmonary disease. Electronically Signed   By: Margarette Canada M.D.   On: 08/26/2018 21:54   Dg Wrist Complete Left  Result Date: 08/26/2018 CLINICAL DATA:  Laceration to LEFT wrist. EXAM: LEFT WRIST  - COMPLETE 3+ VIEW COMPARISON:  None. FINDINGS: No acute fracture, subluxation or dislocation. Mild soft tissue swelling noted. No radiopaque foreign body. No acute bony abnormalities identified. IMPRESSION: Mild soft tissue swelling without acute bony abnormality or radiopaque foreign body. Electronically Signed   By: Margarette Canada M.D.   On: 08/26/2018 21:52   Ct Head Wo Contrast  Result Date: 08/26/2018 CLINICAL DATA:  Suicidal attempt. Alcohol related disorder. EXAM: CT HEAD WITHOUT CONTRAST TECHNIQUE: Contiguous axial images were obtained from the base of the skull through the vertex without intravenous contrast. COMPARISON:  None. FINDINGS: Brain: Moderate degree of small vessel ischemia in the periventricular, deep and subcortical white matter. No acute intraparenchymal hemorrhage, midline shift or edema. Mild sulcal and ventricular prominence advanced for age. Midline fourth ventricle and basal cisterns without effacement. No definite intra-axial mass nor extra-axial fluid. Vascular: No hyperdense vessel sign. Skull: Normal. Negative for fracture or focal lesion. Sinuses/Orbits: No acute finding. Other: None IMPRESSION: 1. Moderate degree of small vessel  ischemia in the periventricular, deep and subcortical white matter. Mild sulcal and ventricular prominence advanced for age. 2. No acute intracranial abnormality. Electronically Signed   By: Ashley Royalty M.D.   On: 08/26/2018 21:49   US Abdomen Limited  Result Date: 08/27/2018 CLINICAL DATA:  58 year old male with elevated LFTs. EXAM: ULTRASOUND ABDOMEN LIMITED RIGHT UPPER QUADRANT COMPARISON:  None. FINDINGS: Gallbladder: Small amount of sludge in the gallbladder. No gallstone, gallbladder wall thickening or pericholecystic fluid. Negative sonographic Murphy's sign. Common bile duct: Not visualized. Liver: There is diffuse increased liver echogenicity most consistent with fatty infiltration. Superimposed fibrosis or inflammation is not excluded.  Clinical correlation is recommended. There is liver is enlarged measuring approximately 21 cm in length. Portal vein is patent on color Doppler imaging with normal direction of blood flow towards the liver. IMPRESSION: 1. Hepatomegaly with fatty infiltration of the liver. 2. No gallstone. Electronically Signed   By: Anner Crete M.D.   On: 08/27/2018 00:38    DISCHARGE EXAMINATION: Vitals:   08/29/18 1200 08/29/18 1806 08/29/18 2048 08/30/18 0645  BP: 106/78 109/79 110/77 132/87  Pulse: (!) 108 (!) 102 98 96  Resp: 20 20 18 14   Temp: 98.6 F (37 C) 98 F (36.7 C) 98.4 F (36.9 C) 98.6 F (37 C)  TempSrc:   Oral   SpO2: 98% 99% 99% 98%  Weight:      Height:       General appearance: Awake alert.  In no distress.  Anxious Resp: Clear to auscultation bilaterally.  Normal effort Cardio: S1-S2 is normal regular.  No S3-S4.  No rubs murmurs or bruit GI: Abdomen is soft.  Nontender nondistended.  Bowel sounds are present normal.  No masses organomegaly Extremities: No edema.  Full range of motion of lower extremities.   DISPOSITION: [facility]      Allergies as of 08/30/2018   No Known Allergies     Medication List    TAKE these medications   cephALEXin 500 MG capsule Commonly known as:  KEFLEX Take 1 capsule (500 mg total) by mouth every 8 (eight) hours for 2 days. Last dose on 1/28.   insulin aspart 100 UNIT/ML injection Commonly known as:  novoLOG Inject 0-5 Units into the skin at bedtime. CBG < 70: implement hypoglycemia protocol CBG 70 - 120: 0 units CBG 121 - 150: 0 units CBG 151 - 200: 0 units CBG 201 - 250: 2 units CBG 251 - 300: 3 units CBG 301 - 350: 4 units CBG 351 - 400: 5 units   insulin aspart 100 UNIT/ML injection Commonly known as:  novoLOG Inject 0-15 Units into the skin 3 (three) times daily with meals. CBG < 70: implement hypoglycemia protocol CBG 70 - 120: 0 units CBG 121 - 150: 2 units CBG 151 - 200: 3 units CBG 201 - 250: 5  units CBG 251 - 300: 8 units CBG 301 - 350: 11 units CBG 351 - 400: 15 units   insulin glargine 100 UNIT/ML injection Commonly known as:  LANTUS Inject 0.15 mLs (15 Units total) into the skin daily. Start taking on:  August 31, 2018   LORazepam 2 MG tablet Commonly known as:  ATIVAN Take 0-2 tablets (0-4 mg total) by mouth every 12 (twelve) hours.   multivitamin with minerals Tabs tablet Take 1 tablet by mouth daily. Start taking on:  August 31, 2018   pantoprazole 20 MG tablet Commonly known as:  PROTONIX Take 1 tablet (20 mg total) by  mouth daily. Start taking on:  August 31, 2018   sertraline 50 MG tablet Commonly known as:  ZOLOFT Take 1 tablet (50 mg total) by mouth daily. Start taking on:  August 31, 2018   thiamine 100 MG tablet Take 1 tablet (100 mg total) by mouth daily. Start taking on:  August 31, 2018   traZODone 50 MG tablet Commonly known as:  DESYREL Take 1 tablet (50 mg total) by mouth at bedtime.          TOTAL DISCHARGE TIME: 35 minutes  Tinsleigh Slovacek [employer] on [email or web address]  08/30/2018, 12:29 PM

## 2018-08-30 NOTE — Plan of Care (Signed)
Pt. Denies si/hi/avh, can contract for safety. Pt. Continues to present sad in his affect and isolative and withdrawn to room.    Problem: Education: Goal: Emotional status will improve Outcome: Not Progressing Goal: Mental status will improve Outcome: Not Progressing   Problem: Safety: Goal: Periods of time without injury will increase Outcome: Progressing

## 2018-08-30 NOTE — Tx Team (Signed)
Initial Treatment Plan 08/30/2018 3:59 PM Todd Rangel WER:[id number]    PATIENT STRESSORS: Financial difficulties Health problems Medication change or noncompliance Substance abuse   PATIENT STRENGTHS: Ability for insight Average or above average intelligence Capable of independent living Communication skills   PATIENT IDENTIFIED PROBLEMS: Depression 08/30/2018  Suicidal  127/2020  Substance Abuse 08/30/2018  Laceration left wrist 08/30/2018               DISCHARGE CRITERIA:  Ability to meet basic life and health needs Improved stabilization in mood, thinking, and/or behavior Medical problems require only outpatient monitoring  PRELIMINARY DISCHARGE PLAN: Outpatient therapy Return to previous living arrangement  PATIENT/FAMILY INVOLVEMENT: This treatment plan has been presented to and reviewed with the patient, [name], and/or family member,  .  The patient and family have been given the opportunity to ask questions and make suggestions.  Leodis Liverpool, RN 08/30/2018, 3:59 PM

## 2018-08-30 NOTE — Progress Notes (Signed)
PT Cancellation Note  Patient Details Name: Todd Rangel MRN: [id number] DOB: 08/04/1961   Cancelled Treatment:    Reason Eval/Treat Not Completed: Fatigue/lethargy limiting ability to participate;Pain limiting ability to participate - Pt reports he is too fatigued to mobilize, and also reports back pain limiting participation. Pt states PT can check back on him tomorrow, refuses further mobility attempts today.  Julien Girt, PT Acute Rehabilitation Services Pager [phone]  Office [phone]    Roxine Caddy D Elonda Husky 08/30/2018, 12:13 PM

## 2018-08-30 NOTE — Progress Notes (Signed)
1:1 Observation Note  1900 Pt. Observed in his room with 1:1 present for safety. Pt. Resting. 2000 Pt. Observed in his room with 1:1 present for safety. Pt. Resting. 2100 Pt. Observed in his room with 1:1 present for safety. Pt. Sleeping with no distress noted. 2200 Pt. Observed in his room with 1:1 present for safety. Pt. Sleeping with no distress noted. 2300 Pt. Observed in his room with 1:1 present for safety. Pt. Sleeping with no distress noted. 0000 Pt. Observed in his room with 1:1 present for safety. Pt. Sleeping with no distress noted. 0100 Pt. Observed in his room with 1:1 present for safety. Pt. Sleeping with no distress noted. 0200 Pt. Observed in his room with 1:1 present for safety. Pt. Sleeping with no distress noted. 0300 Pt. Observed in his room with 1:1 present for safety. Pt. Sleeping with no distress noted. 0400 Pt. Observed in his room with 1:1 present for safety. Pt. Sleeping with no distress noted. 0500 Pt. Observed in his room with 1:1 present for safety. Pt. Sleeping with no distress noted. 0600 Pt. Observed in his room with 1:1 present for safety. Pt. Resting. 0700 Pt. Observed in his room with 1:1 present for safety. Pt. Resting.

## 2018-08-30 NOTE — Progress Notes (Addendum)
12:05 PM Patient has bed at [facility]. LCSW awaiting bed info. LCSW notified attending.    10:40 AM LCSW following for inpatient psych placement.   Patient under review at [facility].   LCSW will continue to follow for placement.   Carolin Coy Kamaili Long [place]

## 2018-08-30 NOTE — Progress Notes (Signed)
Report  Received from Todd Rangel   Admission Note:  D: Pt appeared depressed  With  a flat affect.  Pt  denies SI / AVH at this time. Patient has laceration to left wrist  Dressing done today . Patient voice to writer he cant walk  And has been like this for 2 weeks  Stated he drinks 1/2 gallon of 20% alcohol daily . Stated he relapsed during the holidays . Patient  Un steady on his feet  . Stated it is taking him longer to to gewt back to walking this time  Pt is redirectable and cooperative with assessment.      A: Pt admitted to unit per protocol, skin assessment and search done and no contraband found.Scab on right knee   Pt  educated on therapeutic milieu rules. Pt was introduced to milieu by nursing staff.    R: Pt was receptive to education about the milieu .  15 min safety checks started. Probation officer offered support

## 2018-08-31 ENCOUNTER — Other Ambulatory Visit: Payer: Self-pay | Admitting: Behavioral Health

## 2018-08-31 DIAGNOSIS — F332 Major depressive disorder, recurrent severe without psychotic features: Principal | ICD-10-CM

## 2018-08-31 LAB — GLUCOSE, CAPILLARY
GLUCOSE-CAPILLARY: 313 mg/dL — AB (ref 70–99)
Glucose-Capillary: 230 mg/dL — ABNORMAL HIGH (ref 70–99)
Glucose-Capillary: 365 mg/dL — ABNORMAL HIGH (ref 70–99)
Glucose-Capillary: 109 mg/dL — ABNORMAL HIGH (ref 70–99)

## 2018-08-31 MED ORDER — INSULIN GLARGINE 100 UNIT/ML ~~LOC~~ SOLN
10.0000 [IU] | Freq: Once | SUBCUTANEOUS | Status: AC
  Administered 2018-08-31: 10 [IU] via SUBCUTANEOUS
  Filled 2018-08-31: qty 0.1

## 2018-08-31 MED ORDER — INSULIN ASPART 100 UNIT/ML ~~LOC~~ SOLN
6.0000 [IU] | Freq: Three times a day (TID) | SUBCUTANEOUS | Status: DC
  Administered 2018-08-31 – 2018-09-05 (×16): 6 [IU] via SUBCUTANEOUS
  Filled 2018-08-31 (×4): qty 1

## 2018-08-31 MED ORDER — INSULIN ASPART 100 UNIT/ML ~~LOC~~ SOLN: 0.0000 [IU] | Freq: Every day | SUBCUTANEOUS | Status: DC

## 2018-08-31 MED ORDER — INSULIN ASPART 100 UNIT/ML ~~LOC~~ SOLN
0.0000 [IU] | Freq: Three times a day (TID) | SUBCUTANEOUS | Status: DC
  Administered 2018-08-31: 11 [IU] via SUBCUTANEOUS
  Administered 2018-08-31: 15 [IU] via SUBCUTANEOUS
  Administered 2018-09-01: 2 [IU] via SUBCUTANEOUS
  Administered 2018-09-01 – 2018-09-02 (×3): 3 [IU] via SUBCUTANEOUS
  Administered 2018-09-02: 5 [IU] via SUBCUTANEOUS
  Administered 2018-09-03: 2 [IU] via SUBCUTANEOUS
  Administered 2018-09-03: 3 [IU] via SUBCUTANEOUS
  Administered 2018-09-03: 5 [IU] via SUBCUTANEOUS
  Administered 2018-09-04: 2 [IU] via SUBCUTANEOUS
  Administered 2018-09-04: 5 [IU] via SUBCUTANEOUS
  Administered 2018-09-04: 8 [IU] via SUBCUTANEOUS
  Administered 2018-09-05: 5 [IU] via SUBCUTANEOUS
  Administered 2018-09-05: 3 [IU] via SUBCUTANEOUS
  Filled 2018-08-31 (×2): qty 1

## 2018-08-31 MED ORDER — DULOXETINE HCL 30 MG PO CPEP
30.0000 mg | ORAL_CAPSULE | Freq: Every day | ORAL | Status: DC
  Administered 2018-08-31 – 2018-09-02 (×3): 30 mg via ORAL
  Filled 2018-08-31 (×3): qty 1

## 2018-08-31 MED ORDER — MIRTAZAPINE 15 MG PO TABS
15.0000 mg | ORAL_TABLET | Freq: Every day | ORAL | Status: DC
  Administered 2018-08-31 – 2018-09-03 (×3): 15 mg via ORAL
  Filled 2018-08-31 (×5): qty 1

## 2018-08-31 MED ORDER — INSULIN GLARGINE 100 UNIT/ML ~~LOC~~ SOLN
25.0000 [IU] | Freq: Every day | SUBCUTANEOUS | Status: DC
  Administered 2018-09-01 – 2018-09-05 (×5): 25 [IU] via SUBCUTANEOUS
  Filled 2018-08-31 (×5): qty 0.25

## 2018-08-31 MED ORDER — ENSURE MAX PROTEIN PO LIQD
11.0000 [oz_av] | Freq: Two times a day (BID) | ORAL | Status: DC
  Administered 2018-08-31 – 2018-09-04 (×8): 11 [oz_av] via ORAL
  Filled 2018-08-31: qty 330

## 2018-08-31 NOTE — Progress Notes (Addendum)
Inpatient Diabetes Program Recommendations  AACE/ADA: New Consensus Statement on Inpatient Glycemic Control (2015)  Target Ranges:  Prepandial:   less than 140 mg/dL      Peak postprandial:   less than 180 mg/dL (1-2 hours)      Critically ill patients:  140 - 180 mg/dL   Results for Todd Rangel, Todd Rangel (MRN [id number]) as of 08/31/2018 08:22  Ref. Range 08/30/2018 07:37 08/30/2018 11:48 08/30/2018 16:27 08/30/2018 20:56  Glucose-Capillary Latest Ref Range: 70 - 99 mg/dL 266 (H)  5 units NOVOLOG +  10 units LANTUS at 9am  356 (H)  9 units NOVOLOG  243 (H)  3 units NOVOLOG +  5 units LANTUS at 3pm  347 (H)     15 units LANTUS   Results for Todd Rangel, Todd Rangel (MRN [id number]) as of 08/31/2018 08:22  Ref. Range 08/31/2018 07:06  Glucose-Capillary Latest Ref Range: 70 - 99 mg/dL 230 (H)  3 units NOVOLOG +  15 units LANTUS   Results for Todd Rangel, Todd Rangel (MRN [id number]) as of 08/31/2018 08:22  Ref. Range 08/27/2018 05:54  Hemoglobin A1C Latest Ref Range: 4.8 - 5.6 % 9.9 (H)  (237 mg/dl)    Admit to [facility] ED on 01/23 with Suicide Attempt  History: DM, ETOH Abuse, Recently Diagnosed Brain Tumor  Home DM Meds: Unknown prior to admission  Current Orders: Lantus 15 units Daily      Novolog Moderate Correction Scale/ SSI (0-15 units) TID AC + HS       Admitted to [facility] on 01/23.  Lantus and Novolog started inpatient.  Insulins tirtrated upward yesterday.  Patient received a total of 30 units Lantus yesterday (01/27).  Unknown what medications patient was taking prior to admission.    MD- Please consider the following in-hospital insulin adjustments:  1. Increase Lantus to 25 units Daily (0.25 units/kg dosing)--Please order an extra 10 units Lantus this AM to make a total dose of 25 units this AM  2. Start Novolog Meal Coverage: Novolog 6 units TID with meals  (Please add the following Hold Parameters: Hold if pt eats <50% of meal, Hold if pt NPO)  3. It would be helpful  to know what diabetes medications patient was supposed to be taking prior to admission in order to better determine medication needs inpatient    --Will follow patient during hospitalization--  Wyn Quaker RN, MSN, CDE Diabetes Coordinator Inpatient Glycemic Control Team Team Pager: [phone] (8a-5p)

## 2018-08-31 NOTE — Progress Notes (Signed)
0800 medication room 1:1 present  0900 Patient room  1:1 present  1000 Group craft room  1:1 present 1100 Social Work Assessment    1200 Dayroom 1:1 present  1300 Daily Assessment  With MD in his room  1400  In room on bed  1:1 present 1500 Sleeping  In bed  1:1 present  1600 Dayroom  Sitting  1:1 present  1700 Dayroom  Sitting  1:1 present  1800 Sleeping  In bed  1:1 present  1900Sleeping  In bed  1:1 present

## 2018-08-31 NOTE — Progress Notes (Signed)
Dr. Bary Leriche-   I met w/ pt today (01/28) and he told me he was taking Levemir 50 units BID + Novolog 7 units with normal sized meals and 20 units with big meals prior to admission.  States PCP is the [facility].  Not sure how accurate this Insulin list is, but it will give [place] a starting point to help [place] with insulin titration.    --Will follow patient during hospitalization--  Wyn Quaker RN, MSN, CDE Diabetes Coordinator Inpatient Glycemic Control Team Team Pager: [phone] (8a-5p)
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
## 2018-08-31 NOTE — BHH Suicide Risk Assessment (Signed)
[facility] Admission Suicide Risk Assessment   Nursing information obtained from:  Patient Demographic factors:  Male, Caucasian, Living alone, Unemployed Current Mental Status:  Self-harm thoughts, Suicide plan, Suicidal ideation indicated by patient Loss Factors:  Financial problems / change in socioeconomic status Historical Factors:  Prior suicide attempts Risk Reduction Factors:  Positive social support, Positive coping skills or problem solving skills  Total Time spent with patient: 1 hour Principal Problem: MDD (major depressive disorder), recurrent severe, without psychosis ([place]) Diagnosis:  Principal Problem:   MDD (major depressive disorder), recurrent severe, without psychosis ([place]) Active Problems:   Alcohol use disorder, severe, dependence ([place])   Diabetes mellitus without complication ([place])   Suicidal behavior with attempted self-injury ([place])   Open wound of left wrist   Protein-calorie malnutrition, moderate ([place])  Subjective Data: suicide attempt by cutting  Continued Clinical Symptoms:  Alcohol Use Disorder Identification Test Final Score (AUDIT): 12 The "Alcohol Use Disorders Identification Test", Guidelines for Use in Primary Care, Second Edition.  World Pharmacologist [facility]). Score between 0-7:  no or low risk or alcohol related problems. Score between 8-15:  moderate risk of alcohol related problems. Score between 16-19:  high risk of alcohol related problems. Score 20 or above:  warrants further diagnostic evaluation for alcohol dependence and treatment.   CLINICAL FACTORS:   Depression:   Comorbid alcohol abuse/dependence Hopelessness Impulsivity Alcohol/Substance Abuse/Dependencies Previous Psychiatric Diagnoses and Treatments Medical Diagnoses and Treatments/Surgeries   Musculoskeletal: Strength & Muscle Tone: decreased Gait & Station: unsteady Patient leans: N/A  Psychiatric Specialty Exam: Physical Exam  Nursing note and vitals  reviewed. Psychiatric: Thought content normal. His affect is blunt. His speech is delayed. He is slowed and withdrawn. Cognition and memory are impaired. He expresses impulsivity. He exhibits a depressed mood.    Review of Systems  Constitutional: Positive for malaise/fatigue and weight loss.  Neurological: Positive for tremors and weakness.  Psychiatric/Behavioral: Positive for depression and substance abuse.  All other systems reviewed and are negative.   Blood pressure 118/90, pulse 97, temperature 98.6 F (37 C), temperature source Oral, resp. rate 17, height 5\' 11"  (1.803 m), weight 99.8 kg, SpO2 98 %.Body mass index is 30.68 kg/m.  General Appearance: Disheveled  Eye Contact:  Good  Speech:  Clear and Coherent and Slow  Volume:  Decreased  Mood:  Depressed, Hopeless and Worthless  Affect:  Blunt  Thought Process:  Goal Directed and Descriptions of Associations: Intact  Orientation:  Full (Time, Place, and Person)  Thought Content:  WDL  Suicidal Thoughts:  No  Homicidal Thoughts:  No  Memory:  Immediate;   Poor Recent;   Fair Remote;   Fair  Judgement:  Poor  Insight:  Lacking  Psychomotor Activity:  Psychomotor Retardation  Concentration:  Concentration: Poor and Attention Span: Poor  Recall:  Poor  Fund of Knowledge:  Fair  Language:  Fair  Akathisia:  No  Handed:  Right  AIMS (if indicated):     Assets:  Agricultural consultant Resilience Social Support  ADL's:  Intact  Cognition:  WNL  Sleep:  Number of Hours: 7.45      COGNITIVE FEATURES THAT CONTRIBUTE TO RISK:  None    SUICIDE RISK:   Minimal: No identifiable suicidal ideation.  Patients presenting with no risk factors but with morbid ruminations; may be classified as minimal risk based on the severity of the depressive symptoms  PLAN OF CARE: hospital admission, medication management, substance abuse counseling, discharge planning.  Todd Rangel  is a 58 year old male with  a history of depression and alcoholism transferred from [facility] medical floor where he was hospitalized after suicide attempt by cutting in the context of relapse on alcohol.  #Suicidal ideation -patient able to contract for safety in the hospital  #Mood -start Cymbalta 30 mg -Remeron 15 mg -consider antipsychotic  #Alcohol dependence -detox completed on medical floor -VS are stable -continue Neurontin 300 mg TID  #DM -diabetes nurse coordinator help is greatly appreciated -Lantus 25 units daily -Novolog 6 units with meals -Metformin 1000 mg BID -SSI, ADA diet CBGs  #Weight loss -Glucerna TID  #Wrist cuts -twice daily dressing changes -Keflex 500 mg TID  #GERD -Protonix 40 mg daily  #Deconditioning -PT  Consult appreciated -OK to use walker in the hospital -1:1 sitter  #Substance abuse treatment -left a message with Abby Potash  -patient in need of rehab, housing, follow up   #Disposition -homeless -follow up with the [place] system    I certify that inpatient services furnished can reasonably be expected to improve the patient's condition.   Orson Slick, MD 08/31/2018, 3:04 PM

## 2018-08-31 NOTE — Plan of Care (Signed)
Patient  verbalize understanding of information received  [facility]  Education . Emotional and mental status  improving . Voice of no safety concerns . Voice of no safety concerns . Attending unit programing  verbalization of feeling , working on coping skills    Problem: Education: Goal: Knowledge of [place] [facility] Education information/materials will improve Outcome: Progressing Goal: Emotional status will improve Outcome: Progressing Goal: Mental status will improve Outcome: Progressing Goal: Verbalization of understanding the information provided will improve Outcome: Progressing   Problem: Safety: Goal: Periods of time without injury will increase Outcome: Progressing   Problem: Education: Goal: Knowledge of disease or condition will improve Outcome: Progressing Goal: Understanding of discharge needs will improve Outcome: Progressing   Problem: Coping: Goal: Coping ability will improve Outcome: Progressing   Problem: Coping: Goal: Coping ability will improve Outcome: Progressing Goal: Will verbalize feelings Outcome: Progressing
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
## 2018-08-31 NOTE — Evaluation (Signed)
Physical Therapy Evaluation Patient Details Name: Todd Rangel MRN: [id number] DOB: 19-Oct-1960 Today's Date: 08/31/2018   History of Present Illness  58 y.o. male with medical history significant of alcohol abuse, diabetes mellitus, recently diagnosed brain tumor, hearing loss, who presented with suicidal attempt.  He tried to cut his wrist with a razor.  Patient had also been drinking alcohol quite heavily.  Apparently recently diagnosed with "brain tumor".  This has not been verified yet.  Patient was hospitalized for further management.    Clinical Impression  Patient with flat affect throughout session, behavior WFLs, answered/asked questions appropriately. Patient reported that previously he was living in a rented home alone, independent in ADLs/IADLs.   The patient demonstrated bed mobility independently. Sit <> stand several times during session varying from supervision-CGA, and with and without AD. Improved initial standing balance with RW to stabilize patient. Patient was able to ambulate ~12ft with RW and CGA, exhibited mild gait deviations including short strides, decreased TKE in stance, flexed trunk, and some shakiness, no LOB noted. Patient complained of fatigue at end of mobility.  Overall the patient demonstrated deficits (see "PT Problem List") that impede the patient's functional abilities, safety, and mobility and would benefit from skilled PT intervention. Recommendation is HHPT with supervision for mobility/OOB.      Follow Up Recommendations Home health PT;Supervision for mobility/OOB    Equipment Recommendations  Rolling walker with 5" wheels    Recommendations for Other Services       Precautions / Restrictions Precautions Precautions: Fall Restrictions Weight Bearing Restrictions: No      Mobility  Bed Mobility Overal bed mobility: Independent                Transfers Overall transfer level: Needs assistance Equipment used: None;Rolling walker (2  wheeled) Transfers: Sit to/from Stand Sit to Stand: Supervision;Min guard            Ambulation/Gait Ambulation/Gait assistance: Min guard Gait Distance (Feet): 65 Feet Assistive device: Rolling walker (2 wheeled)   Gait velocity: decreased   General Gait Details: short shuffling steps, difficulty with knee extension bilaterally, complaints of feeling "wobbly". some shakiness noted.  Stairs            Wheelchair Mobility    Modified Rankin (Stroke Patients Only)       Balance Overall balance assessment: Needs assistance Sitting-balance support: Feet supported Sitting balance-Leahy Scale: Good       Standing balance-Leahy Scale: Poor Standing balance comment: Pt needs external support for initial standing balance, but able to progress to fair balance without UE support                             Pertinent Vitals/Pain Pain Assessment: Faces Faces Pain Scale: No hurt Pain Location: Reported body wide aches Pain Intervention(s): Monitored during session;Repositioned    Home Living Family/patient expects to be discharged to:: Private residence((rents)) Living Arrangements: Alone   Type of Home: House Home Access: Level entry     Home Layout: One level Home Equipment: None      Prior Function Level of Independence: Independent               Hand Dominance        Extremity/Trunk Assessment   Upper Extremity Assessment Upper Extremity Assessment: Overall WFL for tasks assessed;RUE deficits/detail;LUE deficits/detail RUE Deficits / Details: grossly 4/5 LUE Deficits / Details: grossly 4/5    Lower Extremity Assessment Lower Extremity  Assessment: RLE deficits/detail;LLE deficits/detail RLE Deficits / Details: hip flexion 3+/5 knee extensoin/flexion 4+/5, DF/PF 4/5 LLE Deficits / Details: hip flexion 3+/5 knee extensoin/flexion 4+/5, DF/PF 4/5       Communication   Communication: No difficulties  Cognition Arousal/Alertness:  Awake/alert Behavior During Therapy: Flat affect Overall Cognitive Status: Within Functional Limits for tasks assessed                                        General Comments      Exercises     Assessment/Plan    PT Assessment Patient needs continued PT services  PT Problem List Decreased strength;Decreased activity tolerance;Decreased balance;Decreased mobility;Decreased knowledge of use of DME       PT Treatment Interventions DME instruction;Therapeutic exercise;Gait training;Balance training;Stair training;Neuromuscular re-education;Therapeutic activities;Patient/family education    PT Goals (Current goals can be found in the Care Plan section)  Acute Rehab PT Goals Patient Stated Goal: Patient wants to get his strength back PT Goal Formulation: With patient Time For Goal Achievement: 09/14/18 Potential to Achieve Goals: Good    Frequency Min 2X/week   Barriers to discharge        Co-evaluation               AM-PAC PT "6 Clicks" Mobility  Outcome Measure Help needed turning from your back to your side while in a flat bed without using bedrails?: None Help needed moving from lying on your back to sitting on the side of a flat bed without using bedrails?: None Help needed moving to and from a bed to a chair (including a wheelchair)?: A Little Help needed standing up from a chair using your arms (e.g., wheelchair or bedside chair)?: A Little Help needed to walk in hospital room?: A Little Help needed climbing 3-5 steps with a railing? : A Lot 6 Click Score: 19    End of Session Equipment Utilized During Treatment: Gait belt Activity Tolerance: Patient limited by fatigue Patient left: in chair;with nursing/sitter in room Nurse Communication: Mobility status PT Visit Diagnosis: Unsteadiness on feet (R26.81);Other abnormalities of gait and mobility (R26.89);Muscle weakness (generalized) (M62.81)    Time: 6440-3474 PT Time Calculation (min)  (ACUTE ONLY): 18 min   Charges:   PT Evaluation $PT Eval Moderate Complexity: [place] PT, DPT 11:22 AM,08/31/18 [phone]

## 2018-08-31 NOTE — Progress Notes (Signed)
D: Patient  verbalize understanding of information received  [facility]  Education . Emotional and mental status  improving . Voice of no safety concerns .  Marland Kitchen Attending unit programing  verbalization of feeling , working on coping skills  Denies suicidal  homicidal ideations  .  No auditory hallucinations  No pain concerns . Appropriate ADL'S. Interacting with peers and staff.   A: Encourage patient participation with unit programming . Instruction  Given on  Medication , verbalize understanding. Dressing  Chang this am shift  Site open  With steri strips No drainage or bright redness  Shown . Patient  Remains on a 1:1 through out shift . Patient seen by physical therapist  This shift . Patient continue to use wheel chair as a means of moving about on unit   R: Voice no other concerns. Staff continue to monitor

## 2018-08-31 NOTE — Plan of Care (Deleted)
Nutrition Education Note   RD consulted for nutrition education regarding diabetes.   Lab Results  Component Value Date   HGBA1C 9.9 (H) 08/27/2018    RD provided "Nutrition and Type II Diabetes" handout from the Academy of Nutrition and Dietetics. Discussed different food groups and their effects on blood sugar, emphasizing carbohydrate-containing foods. Provided list of carbohydrates and recommended serving sizes of common foods.  Discussed importance of controlled and consistent carbohydrate intake throughout the day. Provided examples of ways to balance meals/snacks and encouraged intake of high-fiber, whole grain complex carbohydrates. Teach back method used.  Expect poor compliance.  Koleen Distance MS, RD, LDN Pager #- [phone] Office#- [phone] After Hours Pager: [phone]

## 2018-08-31 NOTE — Progress Notes (Signed)
Initial Nutrition Assessment  DOCUMENTATION CODES:   Not applicable  INTERVENTION:   Ensure Max protein supplement BID, each supplement provides 150kcal and 30g of protein.  MVI daily   Thiamine 125m daily in setting of etoh abuse  DM diet education   NUTRITION DIAGNOSIS:   Predicted suboptimal nutrient intake related to social / environmental circumstances(etoh abuse ) as evidenced by per patient/family report.  GOAL:   Patient will meet greater than or equal to 90% of their needs  MONITOR:   PO intake, Supplement acceptance  REASON FOR ASSESSMENT:   Consult Assessment of nutrition requirement/status, Diet education  ASSESSMENT:   58y.o. male with medical history significant of alcohol abuse, diabetes mellitus, recently diagnosed brain tumor, hearing loss, who presented with suicidal attempt.   Met with pt today to provide DM diet eduction. Pt reports good appetite and oral intake currently; pt eating 100% of meals and drinking supplements. Per chart review, pt is homeless. Pt reports he has been drinking half a gallon of "20% alcohol daily" since Christmas, lost 35 lbs, and has not been complinat with medications including insulin. There is no weight history in chart to confirm wt loss. Pt does appear to have moderate fat and muscle depletions; suspect pt likely with malnutrition. Pt ordered for Glucerna; RD will add Ensure Max protein to help pt meet his estimated needs. MVI and thiamine daily in setting of etoh abuse.      Medications reviewed and include: cephalexin, insulin, thiamine    Labs reviewed: cbgs- 230, 313, 365 x 24hrs AIC 9.9(H)- 1/24  Diet Order:   Diet Order            Diet Carb Modified Fluid consistency: Thin; Room service appropriate? 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  Estimated Nutritional Needs:   Kcal:  2200-2500kcal/day   Protein:  102-117g/day   Fluid:  >2.3L/day   CKoleen DistanceMS, RD, LDN Pager #- [phone]#- [phone] Hours Pager: [phone]

## 2018-08-31 NOTE — Progress Notes (Signed)
Recreation Therapy Notes   Date: 08/31/2018  Time: 9:30 am  Location: Craft Room  Behavioral response: Appropriate  Intervention Topic: Communication   Discussion/Intervention:  Group content today was focused on communication. The group defined communication and ways to communicate with others. Individuals stated reason why communication is important and some reasons to communicate with others. Patients expressed if they thought they were good at communicating with others and ways they could improve their communication skills. The group identified important parts of communication and some experiences they have had in the past with communication. The group participated in the intervention "What is that?", where they had a chance to test out their communication skills and identify ways to improve their communication techniques.  Clinical Observations/Feedback:  Patient came to group and defined communication as identifying body language. He explained communication is important to identify a common task.  Individual was social with peers and staff while participating in the intervention. Santonio Speakman LRT/CTRS         Johnnye Sandford 08/31/2018 11:26 AM

## 2018-08-31 NOTE — Plan of Care (Signed)
Nutrition Education Note   RD consulted for nutrition education regarding diabetes.   Lab Results  Component Value Date   HGBA1C 9.9 (H) 08/27/2018    RD provided "Nutrition and Type II Diabetes" handout from the Academy of [employer] Dietetics. Discussed different food groups and their effects on blood sugar, emphasizing carbohydrate-containing foods. Provided list of carbohydrates and recommended serving sizes of common foods.  Discussed importance of controlled and consistent carbohydrate intake throughout the day. Provided examples of ways to balance meals/snacks and encouraged intake of high-fiber, whole grain complex carbohydrates. Teach back method used.  Expect poor compliance.  Koleen Distance MS, RD, LDN Pager #- [phone] Office#- [phone] After Hours Pager: [phone]

## 2018-08-31 NOTE — BHH Group Notes (Signed)
Feelings Around Diagnosis 08/31/2018 1PM  Type of Therapy/Topic:  Group Therapy:  Feelings about Diagnosis  Participation Level:  Minimal   Description of Group:   This group will allow patients to explore their thoughts and feelings about diagnoses they have received. Patients will be guided to explore their level of understanding and acceptance of these diagnoses. Facilitator will encourage patients to process their thoughts and feelings about the reactions of others to their diagnosis and will guide patients in identifying ways to discuss their diagnosis with significant others in their lives. This group will be process-oriented, with patients participating in exploration of their own experiences, giving and receiving support, and processing challenge from other group members.   Therapeutic Goals: 1. Patient will demonstrate understanding of diagnosis as evidenced by identifying two or more symptoms of the disorder 2. Patient will be able to express two feelings regarding the diagnosis 3. Patient will demonstrate their ability to communicate their needs through discussion and/or role play  Summary of Patient Progress:  Pt came to group late, brought to group by Dr. Bary Leriche. Pt apologized for being late to the group. Minimal interaction with group members but did participate in group icebreaker.     Therapeutic Modalities:   Cognitive Behavioral Therapy Brief Therapy Feelings Identification    GREGREY BLOYD, LCSW 08/31/2018 2:29 PM

## 2018-08-31 NOTE — Progress Notes (Signed)
D: Pt denies SI/HI/AVH, can contract for safety when assessed. Pt. Affect is flat and sad. Pt. Is disheveled in his appearance. Pt. When engaging with this writer about his admission to the unit states,"I've had a lot of responsibility on my shoulders", but reports his mood as, "I'm alright", when his presentation appears dysphoric and depressed. Pt. Does not go into further detail about his depression or anxiety levels.    A: Q x 15 minute observation checks were completed for safety. Patient was provided with education, but needs reinforcement. Patient was given/offered medications per orders. Patient  was encourage to attend groups, participate in unit activities and continue with plan of care. Pt. Chart and plans of care reviewed. Pt. Given support and encouragement.   R: Patient is complaint with medications when brought to his room. Pt. Very isolative and withdrawn to his room this shift. Pt. Expresses he is very tired this evening. Pt. Laceration dressing assessed and clean, dry, and intact. Pt. Denies any pain this evening. Pt. Blood sugars monitored per MD orders. Pt. Given extensive high falls risk education.               Precautionary checks every 15 minutes for safety maintained, room free of safety hazards, patient sustains no injury or falls during this shift. Will endorse care to next shift.

## 2018-08-31 NOTE — BHH Suicide Risk Assessment (Signed)
[facility] INPATIENT:  Family/Significant Other Suicide Prevention Education  Suicide Prevention Education:  Contact Attempts: Sherrill Mckamie, daughter [phone] has been identified by the patient as the family member/significant other with whom the patient will be residing, and identified as the person(s) who will aid the patient in the event of a mental health crisis.  With written consent from the patient, two attempts were made to provide suicide prevention education, prior to and/or following the patient's discharge.  We were unsuccessful in providing suicide prevention education.  A suicide education pamphlet was given to the patient to share with family/significant other.  Date and time of first attempt: 08/31/2018 at 12:10 pm Date and time of second attempt:  CSW was unable to leave a message as phone kept ringing and did not connect to a voicemail. CSW will try to reach [place] at a later date.  Minette Brine K Elton Heid 08/31/2018, 12:12 PM

## 2018-09-01 LAB — GLUCOSE, CAPILLARY
Glucose-Capillary: 155 mg/dL — ABNORMAL HIGH (ref 70–99)
Glucose-Capillary: 130 mg/dL — ABNORMAL HIGH (ref 70–99)
Glucose-Capillary: 155 mg/dL — ABNORMAL HIGH (ref 70–99)
Glucose-Capillary: 85 mg/dL (ref 70–99)

## 2018-09-01 NOTE — Progress Notes (Signed)
Physical Therapy Treatment Patient Details Name: Todd Rangel MRN: [id number] DOB: 1960/09/10 Today's Date: 09/01/2018    History of Present Illness 58 y.o. male with medical history significant of alcohol abuse, diabetes mellitus, recently diagnosed brain tumor, hearing loss, who presented with suicidal attempt.  He tried to cut his wrist with a razor.  Patient had also been drinking alcohol quite heavily.  Apparently recently diagnosed with "brain tumor".  This has not been verified yet.  Patient was hospitalized for further management.    PT Comments    Patient agreeable to PT, ambulated out of bathroom at start of session. Ambulated in total ~264ft with CGA-supervision, intermittent unilateral UE support for balance. Exhibited improved activity tolerance, endurance and less shakiness with ambulation this session. Gait velocity still decreased, and mild shuffling of step noted. Patient also performed therapeutic exercises with demo and verbal cues from PT, complained of fatigue at end of session. Pt seated EOB at end of session, in NAD. The patient would benefit from further skilled PT to continue to maximize mobility, safety, and independence.    Follow Up Recommendations  Home health PT;Supervision for mobility/OOB     Equipment Recommendations  Rolling walker with 5" wheels    Recommendations for Other Services       Precautions / Restrictions Precautions Precautions: Fall Restrictions Weight Bearing Restrictions: No    Mobility  Bed Mobility Overal bed mobility: Independent                Transfers Overall transfer level: Needs assistance Equipment used: None Transfers: Sit to/from Stand Sit to Stand: Supervision         General transfer comment: use of hands  Ambulation/Gait Ambulation/Gait assistance: Min guard;Supervision Gait Distance (Feet): 200 Feet Assistive device: None   Gait velocity: decreased   General Gait Details: Pt used railing  intermittently in hallways, supervision to CGA. Instructed to utilize RW without supervision to ensure safety. Decreased shakiness noted.   Stairs             Wheelchair Mobility    Modified Rankin (Stroke Patients Only)       Balance Overall balance assessment: Needs assistance Sitting-balance support: Feet supported Sitting balance-Leahy Scale: Good       Standing balance-Leahy Scale: Fair                              Cognition Arousal/Alertness: Awake/alert Behavior During Therapy: Flat affect Overall Cognitive Status: Within Functional Limits for tasks assessed                                        Exercises General Exercises - Lower Extremity Long Arc Quad: AROM;Both;15 reps Hip ABduction/ADduction: AROM;Strengthening;Seated;Both;15 reps(manual resistance from PT, both adduction and abduction) Hip Flexion/Marching: AROM;Strengthening;Both;20 reps Toe Raises: Strengthening;Both;20 reps Heel Raises: Strengthening;Both;20 reps Other Exercises Other Exercises: x10 sit to stands with use of UE from wheelchair, CGA/supervision    General Comments        Pertinent Vitals/Pain Pain Assessment: Faces Faces Pain Scale: No hurt    Home Living                      Prior Function            PT Goals (current goals can now be found in the care plan section) Progress towards PT  goals: Progressing toward goals    Frequency    Min 2X/week      PT Plan Current plan remains appropriate    Co-evaluation              AM-PAC PT "6 Clicks" Mobility   Outcome Measure  Help needed turning from your back to your side while in a flat bed without using bedrails?: None Help needed moving from lying on your back to sitting on the side of a flat bed without using bedrails?: None Help needed moving to and from a bed to a chair (including a wheelchair)?: A Little Help needed standing up from a chair using your arms (e.g.,  wheelchair or bedside chair)?: A Little Help needed to walk in hospital room?: A Little Help needed climbing 3-5 steps with a railing? : A Lot 6 Click Score: 19    End of Session Equipment Utilized During Treatment: Gait belt Activity Tolerance: Patient limited by fatigue Patient left: in chair Nurse Communication: Mobility status PT Visit Diagnosis: Unsteadiness on feet (R26.81);Other abnormalities of gait and mobility (R26.89);Muscle weakness (generalized) (M62.81)     Time: 1030-1047 PT Time Calculation (min) (ACUTE ONLY): 17 min  Charges:  $Therapeutic Exercise: 8-22 mins                     Lieutenant Diego PT, DPT 11:40 AM,09/01/18 [phone]

## 2018-09-01 NOTE — BHH Suicide Risk Assessment (Signed)
[facility] INPATIENT:  Family/Significant Other Suicide Prevention Education  Suicide Prevention Education:  Contact Attempts: Benjamine Strout, daughter [phone] has been identified by the patient as the family member/significant other with whom the patient will be residing, and identified as the person(s) who will aid the patient in the event of a mental health crisis.  With written consent from the patient, two attempts were made to provide suicide prevention education, prior to and/or following the patient's discharge.  We were unsuccessful in providing suicide prevention education.  A suicide education pamphlet was given to the patient to share with family/significant other.  Date and time of first attempt: 08/31/2018 at 12:10 pm Date and time of second attempt: 09/01/18 at 9:50 am  CSW was unable to leave a message as phone kept ringing and did not connect to a voicemail. SPE completed with pt, as CSW was unable to reach family contact. SPI pamphlet provided to pt and pt was encouraged to share information with support network, ask questions, and talk about any concerns relating to SPE. Pt denies access to guns/firearms and verbalized understanding of information provided. Mobile Crisis information also provided to pt.    Mariann Laster Yuta Cipollone 09/01/2018, 9:52 AM

## 2018-09-01 NOTE — Plan of Care (Signed)
D- Patient alert and oriented. Patient presents in a pleasant mood on assessment stating that he slept fair last night and had complaints about his left wrist pain, "5/10", but did not request any pain medication from this Probation officer. Patient rated his depression a "10/10" stating that "50 things" is why he's depressed. Patient rated his anxiety a "3/10" stating that "am I going to be able to get out of this fit" is why he's anxious. Patient denies SI, HI, AVH, at this time. Patient's goal for today is "hopefulness", in which he will "participate" in order to accomplish this goal.   A- Scheduled medications administered to patient, per MD orders. Support and encouragement provided.  Routine safety checks conducted every 15 minutes.  Patient informed to notify staff with problems or concerns.  R- No adverse drug reactions noted. Patient contracts for safety at this time. Patient compliant with medications and treatment plan. Patient receptive, calm, and cooperative. Patient interacts well with others on the unit.  Patient remains safe at this time.    Problem: Education: Goal: Knowledge of [facility] Education information/materials will improve Outcome: Progressing Goal: Emotional status will improve Outcome: Progressing Goal: Mental status will improve Outcome: Progressing Goal: Verbalization of understanding the information provided will improve Outcome: Progressing   Problem: Safety: Goal: Periods of time without injury will increase Outcome: Progressing   Problem: Education: Goal: Knowledge of disease or condition will improve Outcome: Progressing Goal: Understanding of discharge needs will improve Outcome: Progressing   Problem: Coping: Goal: Coping ability will improve Outcome: Progressing   Problem: Coping: Goal: Coping ability will improve Outcome: Progressing Goal: Will verbalize feelings Outcome: Progressing

## 2018-09-01 NOTE — Plan of Care (Signed)
Patient is stable and restful in bed with eyes closed, 1:1 continued for safety.

## 2018-09-01 NOTE — Progress Notes (Signed)
CSW contacted [facility] [phone] ext. [phone] and spoke to Justice Rocher to inquire about process to get pt transferred to a [place] medical facility. Nadine provided CSW with phone number for travel coordinator nurse at inpatient facility in [place]. CSW contacted April Alexander [phone] at [facility] hospital. April reported that there are currently no beds on the inpatient or medical side and pt would have to come in and could not be transferred. April reported for substance abuse treatment, pt could inquire about a substance abuse intake at his scheduled appointment Monday 09/06/18 or walk in to [facility] Monday-Friday 11am-1pm. Per April they will not be able to give him a bed date at that time and it could be anywhere from 2-4 weeks out. For housing assistance, April stated to have pt reach out to [employer] at [phone] ext. [phone] or he could walk in at [place] or Friday 10am-12pm.    Evalina Field, MSW, LCSW Clinical Social Work 09/01/2018 2:22 PM

## 2018-09-01 NOTE — Progress Notes (Signed)
Patient is continued on 1:1 for safety and in bed restful, no distress.

## 2018-09-01 NOTE — BHH Group Notes (Signed)
LCSW Group Therapy Note  09/01/2018 1:00 PM  Type of Therapy/Topic:  Group Therapy:  Emotion Regulation  Participation Level:  Active   Description of Group:   The purpose of this group is to assist patients in learning to regulate negative emotions and experience positive emotions. Patients will be guided to discuss ways in which they have been vulnerable to their negative emotions. These vulnerabilities will be juxtaposed with experiences of positive emotions or situations, and patients will be challenged to use positive emotions to combat negative ones. Special emphasis will be placed on coping with negative emotions in conflict situations, and patients will process healthy conflict resolution skills.  Therapeutic Goals: 1. Patient will identify two positive emotions or experiences to reflect on in order to balance out negative emotions 2. Patient will label two or more emotions that they find the most difficult to experience 3. Patient will demonstrate positive conflict resolution skills through discussion and/or role plays  Summary of Patient Progress: Patient was an active participant in group.  Patient was supportive of other group members.  Patient discussed how cultural differences can negatively impact a relationship.  Patient discussed how negative feelings that he has felt recently have been " depression".  Patient discussed how he felt that he has attempted to combat his depression be engaging in contribution to others.  Patient engaged in discussion on effective coping skills: including playing pool or cards.    Therapeutic Modalities:   Cognitive Behavioral Therapy Feelings Identification Dialectical Behavioral Therapy  Assunta Curtis, MSW, LCSW 09/01/2018 2:35 PM

## 2018-09-01 NOTE — Progress Notes (Signed)
Recreation Therapy Notes  INPATIENT RECREATION THERAPY ASSESSMENT  Patient Details Name: Todd Rangel MRN: [id number] DOB: [birth year] Today's Date: 09/01/2018       Information Obtained From: Patient  Able to Participate in Assessment/Interview: Yes  Patient Presentation: Responsive  Reason for Admission (Per Patient): Active Symptoms, Suicidal Ideation, Substance Abuse  Patient Stressors: Work  Radiographer, therapeutic:   Substance Abuse, Retail buyer)  Leisure Interests (2+):  Social - Family  Frequency of Recreation/Participation: Biomedical engineer of Community Resources:  Yes  Community Resources:  Park, Other ([employer])  Current Use: Yes  If no, Barriers?:    Expressed Interest in [place]:    [employer] of Residence:  [employer]  Patient Main Form of Transportation: Musician  Patient Strengths:  Tolerant, good listener  Patient Identified Areas of Improvement:  Be better spiritual hygiene and better integrity   Patient Goal for Hospitalization:  Get out of this pit and get my brain focused  Current SI (including self-harm):  No  Current HI:  No  Current AVH: No  Staff Intervention Plan: Group Attendance, Collaborate with Interdisciplinary Treatment Team  Consent to Intern Participation: N/A  Todd Rangel 09/01/2018, 3:32 PM

## 2018-09-01 NOTE — Progress Notes (Signed)
Recreation Therapy Notes   Date: 09/01/2018  Time: 9:30 am  Location: Craft Room  Behavioral response: Appropriate  Intervention Topic: Happiness  Discussion/Intervention:  Group content today was focused on Happiness. The group defined happiness and described where happiness comes from. Individuals identified what makes them happy and how they go about making others happy. Patients expressed things that stop them from being happy and ways they can improve their happiness. The group stated reasons why it is important to be happy. The group participated in the intervention "My Happiness", where they had a chance to identify and express things that make them happy. Clinical Observations/Feedback:  Patient came to group and defined happiness as being content, He stated unpaid bills stop him from being happy. Participant expalined that it is important to be happy to focus on the navigation of life. Individual was social with peers and staff while participating in the intervention.  Todd Rangel LRT/CTRS            [name] 09/01/2018 12:28 PM

## 2018-09-01 NOTE — Progress Notes (Signed)
[facility] MD Progress Note  09/02/2018 1:17 PM Todd Rangel  MRN:  [id number]  Subjective:    Mr. Zukas is feeling better physically but is still depressed, feel utterly hopeless and worthless and is unable to contract for safety in the hospital. Tolerates medications well so far. He no longer uses a walker.  Spoke with [facility], Hassan Rowan, at the [place] who will try to facilitate admission to the [place] system.  Principal Problem: MDD (major depressive disorder), recurrent severe, without psychosis ([place]) Diagnosis: Principal Problem:   MDD (major depressive disorder), recurrent severe, without psychosis ([place]) Active Problems:   Alcohol use disorder, severe, dependence ([place])   Diabetes mellitus without complication ([place])   Suicidal behavior with attempted self-injury ([place])   Open wound of left wrist   Protein-calorie malnutrition, moderate ([place])  Total Time spent with patient: 20 minutes  Past Psychiatric History: depresion, alcoholism  Past Medical History:  Past Medical History:  Diagnosis Date  . Alcohol abuse   . Brain tumor ([place])   . Diabetes mellitus without complication ([place])   . 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 CERVICAL FUSION     per pt's report   Family History:  Family History  Problem Relation Age of Onset  . Lung cancer Mother   . Liver cancer Father    Family Psychiatric  History: dwprssion, schizophrenia, suicide Social History:  Social History   Substance and Sexual Activity  Alcohol Use Yes     Social History   Substance and Sexual Activity  Drug Use Never    Social History   Socioeconomic History  . Marital status: Divorced    Spouse name: Not on file  . Number of children: Not on file  . Years of education: Not on file  . Highest education level: Not on file  Occupational History  . Not on file  Social Needs  . Financial  resource strain: Not on file  . Food insecurity:    Worry: Not on file    Inability: Not on file  . Transportation needs:    Medical: Not on file    Non-medical: Not on file  Tobacco Use  . Smoking status: Former Research scientist (life sciences)  . Smokeless tobacco: Never Used  Substance and Sexual Activity  . Alcohol use: Yes  . Drug use: Never  . Sexual activity: Not on file  Lifestyle  . Physical activity:    Days per week: Not on file    Minutes per session: Not on file  . Stress: Not on file  Relationships  . Social connections:    Talks on phone: Not on file    Gets together: Not on file    Attends religious service: Not on file    Active member of club or organization: Not on file    Attends meetings of clubs or organizations: Not on file    Relationship status: Not on file  Other Topics Concern  . Not on file  Social History Narrative  . Not on file   Additional Social History:                         Sleep: Fair  Appetite:  Fair  Current Medications: Current Facility-Administered Medications  Medication Dose Route Frequency Provider Last Rate Last Dose  . acetaminophen (TYLENOL) tablet 650 mg  650 mg Oral Q6H  PRN Lavella Hammock, MD      . alum & mag hydroxide-simeth (MAALOX/MYLANTA) 200-200-20 MG/5ML suspension 30 mL  30 mL Oral Q4H PRN Lavella Hammock, MD      . DULoxetine (CYMBALTA) DR capsule 30 mg  30 mg Oral Daily Jovanny Stephanie B, MD   30 mg at 09/02/18 0815  . feeding supplement (GLUCERNA SHAKE) (GLUCERNA SHAKE) liquid 237 mL  237 mL Oral TID BM Razia Screws B, MD   237 mL at 09/01/18 2142  . hydrOXYzine (ATARAX/VISTARIL) tablet 25 mg  25 mg Oral Q4H PRN Lavella Hammock, MD      . insulin aspart (novoLOG) injection 0-15 Units  0-15 Units Subcutaneous TID WC Tiyana Galla B, MD   3 Units at 09/02/18 1153  . insulin aspart (novoLOG) injection 0-5 Units  0-5 Units Subcutaneous QHS Malone Admire B, MD      . insulin aspart (novoLOG) injection  6 Units  6 Units Subcutaneous TID WC Shekera Beavers B, MD   6 Units at 09/02/18 1152  . insulin glargine (LANTUS) injection 25 Units  25 Units Subcutaneous Daily Townes Fuhs B, MD   25 Units at 09/02/18 0809  . magnesium hydroxide (MILK OF MAGNESIA) suspension 30 mL  30 mL Oral Daily PRN Lavella Hammock, MD      . mirtazapine (REMERON) tablet 15 mg  15 mg Oral QHS Shavell Nored B, MD   15 mg at 09/01/18 2142  . multivitamin with minerals tablet 1 tablet  1 tablet Oral Daily Lavella Hammock, MD   1 tablet at 09/02/18 0815  . mupirocin ointment (BACTROBAN) 2 %   Topical Daily Lavella Hammock, MD      . ondansetron [facility]) injection 4 mg  4 mg Intravenous Q8H PRN Lavella Hammock, MD      . pantoprazole (PROTONIX) EC tablet 20 mg  20 mg Oral Daily Lavella Hammock, MD   20 mg at 09/02/18 0815  . protein supplement (ENSURE MAX) liquid  11 oz Oral BID Taleigh Gero B, MD   11 oz at 09/01/18 1718  . thiamine (VITAMIN B-1) tablet 100 mg  100 mg Oral Daily Lavella Hammock, MD   100 mg at 09/02/18 0816  . traZODone (DESYREL) tablet 50 mg  50 mg Oral QHS Lavella Hammock, MD   50 mg at 09/01/18 2142    Lab Results:  Results for orders placed or performed during the hospital encounter of 08/30/18 (from the past 48 hour(s))  Glucose, capillary     Status: Abnormal   Collection Time: 08/31/18  4:33 PM  Result Value Ref Range   Glucose-Capillary 365 (H) 70 - 99 mg/dL   Comment 1 Notify RN   Glucose, capillary     Status: Abnormal   Collection Time: 08/31/18  8:32 PM  Result Value Ref Range   Glucose-Capillary 109 (H) 70 - 99 mg/dL  Glucose, capillary     Status: Abnormal   Collection Time: 09/01/18  6:59 AM  Result Value Ref Range   Glucose-Capillary 155 (H) 70 - 99 mg/dL   Comment 1 Notify RN   Glucose, capillary     Status: Abnormal   Collection Time: 09/01/18 11:49 AM  Result Value Ref Range   Glucose-Capillary 130 (H) 70 - 99 mg/dL  Glucose, capillary     Status:  Abnormal   Collection Time: 09/01/18  4:18 PM  Result Value Ref Range   Glucose-Capillary 155 (H) 70 - 99  mg/dL  Glucose, capillary     Status: None   Collection Time: 09/01/18  8:28 PM  Result Value Ref Range   Glucose-Capillary 85 70 - 99 mg/dL  Glucose, capillary     Status: Abnormal   Collection Time: 09/02/18  7:08 AM  Result Value Ref Range   Glucose-Capillary 218 (H) 70 - 99 mg/dL   Comment 1 Notify RN   Glucose, capillary     Status: Abnormal   Collection Time: 09/02/18 10:56 AM  Result Value Ref Range   Glucose-Capillary 163 (H) 70 - 99 mg/dL    Blood Alcohol level:  Lab Results  Component Value Date   ETH 169 (H) 85/27/7824    Metabolic Disorder Labs: Lab Results  Component Value Date   HGBA1C 9.9 (H) 08/27/2018   MPG 237.43 08/27/2018   No results found for: PROLACTIN No results found for: CHOL, TRIG, HDL, CHOLHDL, VLDL, LDLCALC  Physical Findings: AIMS:  , ,  ,  ,    CIWA:  CIWA-Ar Total: 0 COWS:     Musculoskeletal: Strength & Muscle Tone: within normal limits Gait & Station: normal Patient leans: N/A  Psychiatric Specialty Exam: Physical Exam  Nursing note and vitals reviewed. Psychiatric: His speech is normal. His affect is blunt. He is slowed and withdrawn. Cognition and memory are impaired. He expresses impulsivity. He exhibits a depressed mood. He expresses suicidal ideation.    Review of Systems  Constitutional: Positive for malaise/fatigue and weight loss.  Neurological: Positive for weakness.  Psychiatric/Behavioral: Positive for depression, substance abuse and suicidal ideas.  All other systems reviewed and are negative.   Blood pressure 130/89, pulse 95, temperature 98.8 F (37.1 C), temperature source Oral, resp. rate 18, height 5\' 11"  (1.803 m), weight 99.8 kg, SpO2 100 %.Body mass index is 30.68 kg/m.  General Appearance: Disheveled  Eye Contact:  Fair  Speech:  Clear and Coherent  Volume:  Decreased  Mood:  Depressed, Hopeless  and Worthless  Affect:  Blunt  Thought Process:  Goal Directed and Descriptions of Associations: Intact  Orientation:  Full (Time, Place, and Person)  Thought Content:  WDL  Suicidal Thoughts:  Yes.  without intent/plan  Homicidal Thoughts:  No  Memory:  Immediate;   Fair Recent;   Fair Remote;   Fair  Judgement:  Poor  Insight:  Lacking  Psychomotor Activity:  Psychomotor Retardation  Concentration:  Concentration: Fair and Attention Span: Fair  Recall:  [employer] of Knowledge:  Fair  Language:  Fair  Akathisia:  No  Handed:  Right  AIMS (if indicated):     Assets:  Communication Skills Desire for Improvement Resilience  ADL's:  Intact  Cognition:  WNL  Sleep:  Number of Hours: 6.45     Treatment Plan Summary: Daily contact with patient to assess and evaluate symptoms and progress in treatment and Medication management   Mr. Paladino is a 58 year old male with a history of depression and alcoholism transferred from [facility] medical floor where he was hospitalized after suicide attempt by cutting in the context of relapse on alcohol.  #Suicidal ideation, still suicidal -patient able to contract for safety in the hospital  #Mood, no improved -increase Cymbalta to 60 mg -Remeron 15 mg -start Risperdal 2 mg nightly   #Alcohol dependence -detox completed on medical floor -VS are stable -discontinue Neurontin 300 mg TID per patient request  #DM -diabetes nurse coordinator help is greatly appreciated -Lantus 25 units daily -Novolog 6 units with meals -Metformin 1000  mg BID -SSI, ADA diet CBGs  #Weight loss -Ensure Plus TID  #Wrist cuts -twice daily dressing changes -Keflex 500 mg TID  #GERD -Protonix 40 mg daily  #Deconditioning -PT Consult appreciated -OK to use walker in the hospital -discontinue 1:1 sitter  #Substance abuse treatment -patient desires residential treatment  #Disposition -will continue attempts to transfer to the [facility] hospital  for further treatment  -patient in need of hospitalization, rehab and housing assistance   #Disposition -[facility] -homeless  Orson Slick, MD 09/02/2018, 1:17 PM

## 2018-09-01 NOTE — Progress Notes (Signed)
Patient is compliant with his medications and remain on 1:1 still for safety no falls.

## 2018-09-01 NOTE — Tx Team (Addendum)
Interdisciplinary Treatment and Diagnostic Plan Update  09/01/2018 Time of Session: 1030AM Tyrick Dunagan MRN: [id number]  Principal Diagnosis: MDD (major depressive disorder), recurrent severe, without psychosis ([place])  Secondary Diagnoses: Principal Problem:   MDD (major depressive disorder), recurrent severe, without psychosis ([place]) Active Problems:   Alcohol use disorder, severe, dependence ([place])   Diabetes mellitus without complication ([place])   Suicidal behavior with attempted self-injury ([place])   Open wound of left wrist   Protein-calorie malnutrition, moderate ([place])   Current Medications:  Current Facility-Administered Medications  Medication Dose Route Frequency Provider Last Rate Last Dose  . acetaminophen (TYLENOL) tablet 650 mg  650 mg Oral Q6H PRN Lavella Hammock, MD      . alum & mag hydroxide-simeth (MAALOX/MYLANTA) 200-200-20 MG/5ML suspension 30 mL  30 mL Oral Q4H PRN Lavella Hammock, MD      . DULoxetine (CYMBALTA) DR capsule 30 mg  30 mg Oral Daily Pucilowska, Jolanta B, MD   30 mg at 09/01/18 0909  . feeding supplement (GLUCERNA SHAKE) (GLUCERNA SHAKE) liquid 237 mL  237 mL Oral TID BM Pucilowska, Jolanta B, MD   237 mL at 08/31/18 2124  . gabapentin (NEURONTIN) capsule 300 mg  300 mg Oral TID Pucilowska, Jolanta B, MD   300 mg at 08/31/18 1722  . hydrOXYzine (ATARAX/VISTARIL) tablet 25 mg  25 mg Oral Q4H PRN Lavella Hammock, MD      . insulin aspart (novoLOG) injection 0-15 Units  0-15 Units Subcutaneous TID WC Pucilowska, Jolanta B, MD   3 Units at 09/01/18 0759  . insulin aspart (novoLOG) injection 0-5 Units  0-5 Units Subcutaneous QHS Pucilowska, Jolanta B, MD      . insulin aspart (novoLOG) injection 6 Units  6 Units Subcutaneous TID WC Pucilowska, Jolanta B, MD   6 Units at 09/01/18 0759  . insulin glargine (LANTUS) injection 25 Units  25 Units Subcutaneous Daily Pucilowska, Jolanta B, MD   25 Units at 09/01/18 0800  . magnesium hydroxide (MILK OF MAGNESIA)  suspension 30 mL  30 mL Oral Daily PRN Lavella Hammock, MD      . mirtazapine (REMERON) tablet 15 mg  15 mg Oral QHS Pucilowska, Jolanta B, MD   15 mg at 08/31/18 2124  . multivitamin with minerals tablet 1 tablet  1 tablet Oral Daily Lavella Hammock, MD   1 tablet at 09/01/18 [phone]  . mupirocin ointment (BACTROBAN) 2 %   Topical Daily Lavella Hammock, MD      . ondansetron [facility]) injection 4 mg  4 mg Intravenous Q8H PRN Lavella Hammock, MD      . pantoprazole (PROTONIX) EC tablet 20 mg  20 mg Oral Daily Lavella Hammock, MD   20 mg at 09/01/18 5176  . protein supplement (ENSURE MAX) liquid  11 oz Oral BID Pucilowska, Jolanta B, MD   11 oz at 08/31/18 1726  . thiamine (VITAMIN B-1) tablet 100 mg  100 mg Oral Daily Lavella Hammock, MD   100 mg at 09/01/18 1607  . traZODone (DESYREL) tablet 50 mg  50 mg Oral QHS Lavella Hammock, MD   50 mg at 08/31/18 2124   PTA Medications: Medications Prior to Admission  Medication Sig Dispense Refill Last Dose  . cephALEXin (KEFLEX) 500 MG capsule Take 1 capsule (500 mg total) by mouth every 8 (eight) hours for 2 days. Last dose on 1/28.     Marland Kitchen insulin aspart (NOVOLOG) 100 UNIT/ML injection Inject 0-5 Units into  the skin at bedtime. CBG < 70: implement hypoglycemia protocol CBG 70 - 120: 0 units CBG 121 - 150: 0 units CBG 151 - 200: 0 units CBG 201 - 250: 2 units CBG 251 - 300: 3 units CBG 301 - 350: 4 units CBG 351 - 400: 5 units 10 mL 11   . insulin aspart (NOVOLOG) 100 UNIT/ML injection Inject 0-15 Units into the skin 3 (three) times daily with meals. CBG < 70: implement hypoglycemia protocol CBG 70 - 120: 0 units CBG 121 - 150: 2 units CBG 151 - 200: 3 units CBG 201 - 250: 5 units CBG 251 - 300: 8 units CBG 301 - 350: 11 units CBG 351 - 400: 15 units 10 mL 11   . insulin glargine (LANTUS) 100 UNIT/ML injection Inject 0.15 mLs (15 Units total) into the skin daily. 10 mL 11   . LORazepam (ATIVAN) 2 MG tablet Take 0-2 tablets (0-4 mg total) by  mouth every 12 (twelve) hours. 30 tablet 0   . Multiple Vitamin (MULTIVITAMIN WITH MINERALS) TABS tablet Take 1 tablet by mouth daily.     . pantoprazole (PROTONIX) 20 MG tablet Take 1 tablet (20 mg total) by mouth daily.     . sertraline (ZOLOFT) 50 MG tablet Take 1 tablet (50 mg total) by mouth daily.     Marland Kitchen thiamine 100 MG tablet Take 1 tablet (100 mg total) by mouth daily.     . traZODone (DESYREL) 50 MG tablet Take 1 tablet (50 mg total) by mouth at bedtime.       Patient Stressors: Financial difficulties Health problems Medication change or noncompliance Substance abuse  Patient Strengths: Ability for insight Average or above average intelligence Capable of independent living Communication skills  Treatment Modalities: Medication Management, Group therapy, Case management,  1 to 1 session with clinician, Psychoeducation, Recreational therapy.   Physician Treatment Plan for Primary Diagnosis: MDD (major depressive disorder), recurrent severe, without psychosis ([place]) Long Term Goal(s): Improvement in symptoms so as ready for discharge Improvement in symptoms so as ready for discharge   Short Term Goals: Ability to identify changes in lifestyle to reduce recurrence of condition will improve Ability to verbalize feelings will improve Ability to disclose and discuss suicidal ideas Ability to demonstrate self-control will improve Ability to identify and develop effective coping behaviors will improve Ability to maintain clinical measurements within normal limits will improve Compliance with prescribed medications will improve Ability to identify triggers associated with substance abuse/mental health issues will improve Ability to identify changes in lifestyle to reduce recurrence of condition will improve Ability to identify and develop effective coping behaviors will improve Ability to identify triggers associated with substance abuse/mental health issues will  improve  Medication Management: Evaluate patient's response, side effects, and tolerance of medication regimen.  Therapeutic Interventions: 1 to 1 sessions, Unit Group sessions and Medication administration.  Evaluation of Outcomes: Not Met  Physician Treatment Plan for Secondary Diagnosis: Principal Problem:   MDD (major depressive disorder), recurrent severe, without psychosis ([place]) Active Problems:   Alcohol use disorder, severe, dependence ([place])   Diabetes mellitus without complication ([place])   Suicidal behavior with attempted self-injury ([place])   Open wound of left wrist   Protein-calorie malnutrition, moderate ([place])  Long Term Goal(s): Improvement in symptoms so as ready for discharge Improvement in symptoms so as ready for discharge   Short Term Goals: Ability to identify changes in lifestyle to reduce recurrence of condition will improve Ability to verbalize feelings will improve  Ability to disclose and discuss suicidal ideas Ability to demonstrate self-control will improve Ability to identify and develop effective coping behaviors will improve Ability to maintain clinical measurements within normal limits will improve Compliance with prescribed medications will improve Ability to identify triggers associated with substance abuse/mental health issues will improve Ability to identify changes in lifestyle to reduce recurrence of condition will improve Ability to identify and develop effective coping behaviors will improve Ability to identify triggers associated with substance abuse/mental health issues will improve     Medication Management: Evaluate patient's response, side effects, and tolerance of medication regimen.  Therapeutic Interventions: 1 to 1 sessions, Unit Group sessions and Medication administration.  Evaluation of Outcomes: Not Met   RN Treatment Plan for Primary Diagnosis: MDD (major depressive disorder), recurrent severe, without psychosis ([place]) Long Term  Goal(s): Knowledge of disease and therapeutic regimen to maintain health will improve  Short Term Goals: Ability to demonstrate self-control, Ability to participate in decision making will improve, Ability to verbalize feelings will improve, Ability to disclose and discuss suicidal ideas and Ability to identify and develop effective coping behaviors will improve  Medication Management: RN will administer medications as ordered by provider, will assess and evaluate patient's response and provide education to patient for prescribed medication. RN will report any adverse and/or side effects to prescribing provider.  Therapeutic Interventions: 1 on 1 counseling sessions, Psychoeducation, Medication administration, Evaluate responses to treatment, Monitor vital signs and CBGs as ordered, Perform/monitor CIWA, COWS, AIMS and Fall Risk screenings as ordered, Perform wound care treatments as ordered.  Evaluation of Outcomes: Not Met   LCSW Treatment Plan for Primary Diagnosis: MDD (major depressive disorder), recurrent severe, without psychosis ([place]) Long Term Goal(s): Safe transition to appropriate next level of care at discharge, Engage patient in therapeutic group addressing interpersonal concerns.  Short Term Goals: Engage patient in aftercare planning with referrals and resources, Increase emotional regulation, Identify triggers associated with mental health/substance abuse issues and Increase skills for wellness and recovery  Therapeutic Interventions: Assess for all discharge needs, 1 to 1 time with Social worker, Explore available resources and support systems, Assess for adequacy in community support network, Educate family and significant other(s) on suicide prevention, Complete Psychosocial Assessment, Interpersonal group therapy.  Evaluation of Outcomes: Not Met   Progress in Treatment: Attending groups: Yes. Participating in groups: Yes. Taking medication as prescribed: Yes. Toleration  medication: Yes. Family/Significant other contact made: No, CSW attempted to call pt's daughter twice and was unable to speak with her. SPE completed with pt. Patient understands diagnosis: Yes. Discussing patient identified problems/goals with staff: Yes. Medical problems stabilized or resolved: Yes. Denies suicidal/homicidal ideation: Yes. Passive SI, able to contract for safety on the unit Issues/concerns per patient self-inventory: No. Other: n/a  New problem(s) identified: Yes, Describe:  Pt receives VA benefits, would like pt to be transferred to a [place] hospital  New Short Term/Long Term Goal(s):  medication management for mood stabilization; elimination of SI thoughts; development of comprehensive mental wellness/sobriety plan.    Patient Goals:  "to get my brain back to a good focus and focus on the next steps"  Discharge Plan or Barriers: Pt reports he was evicted from his home prior to hospitalization and has no where to return to.  Reason for Continuation of Hospitalization: Depression Medication stabilization Suicidal ideation  Estimated Length of Stay: Monday 09/06/18  Recreational Therapy: Patient Stressors: N/A  Patient Goal: Patient will successfully identify 2 ways of making healthy decisions post d/c within 5  recreation therapy group sessions  Attendees: Patient: Keyshon Stein 09/01/2018 11:29 AM  Physician: Dr Bary Leriche, MD 09/01/2018 11:29 AM  Nursing:  09/01/2018 11:29 AM  RN Care Manager: 09/01/2018 11:29 AM  Social Worker: Evalina Field, LCSW 09/01/2018 11:29 AM  Recreational Therapist: Roanna Epley, Reather Converse, LRT 09/01/2018 11:29 AM  Other: Assunta Curtis, LCSW 09/01/2018 11:29 AM  Other: Joice Lofts, LCSW 09/01/2018 11:29 AM  Other: 09/01/2018 11:29 AM    Scribe for Treatment Team: Mariann Laster Moton, LCSW 09/01/2018 11:29 AM

## 2018-09-01 NOTE — Progress Notes (Signed)
[facility] MD Progress Note  09/01/2018 3:16 PM Jerritt Cardoza  MRN:  [id number]  Subjective:    Mr. Cromie met with treatment team today. He is no longer tremolous and shows no other symptoms of alcohol withdrawal. He is walking with a walker, was assessed by PT and sitter was discontinued. Appetite is fair. He has bee going to groups. Diabetes in better controlled with insulin and Metformin. He feels utterly hopeless and still considers suicideonce discharged. He has no money or a place to go.   [facility] for further treatment of depression, alcoholism and homelessness. No beds available today.  Principal Problem: MDD (major depressive disorder), recurrent severe, without psychosis ([place]) Diagnosis: Principal Problem:   MDD (major depressive disorder), recurrent severe, without psychosis ([place]) Active Problems:   Alcohol use disorder, severe, dependence ([place])   Diabetes mellitus without complication ([place])   Suicidal behavior with attempted self-injury ([place])   Open wound of left wrist   Protein-calorie malnutrition, moderate ([place])  Total Time spent with patient: 20 minutes  Past Psychiatric History: depression and alcoholism  Past Medical History:  Past Medical History:  Diagnosis Date  . Alcohol abuse   . Brain tumor ([place])   . Diabetes mellitus without complication ([place])   . 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 CERVICAL FUSION     per pt's report   Family History:  Family History  Problem Relation Age of Onset  . Lung cancer Mother   . Liver cancer Father    Family Psychiatric  History: depression, schizophrenia, suicide Social History:  Social History   Substance and Sexual Activity  Alcohol Use Yes     Social History   Substance and Sexual Activity  Drug Use Never    Social History   Socioeconomic History  . Marital  status: Divorced    Spouse name: Not on file  . Number of children: Not on file  . Years of education: Not on file  . Highest education level: Not on file  Occupational History  . Not on file  Social Needs  . Financial resource strain: Not on file  . Food insecurity:    Worry: Not on file    Inability: Not on file  . Transportation needs:    Medical: Not on file    Non-medical: Not on file  Tobacco Use  . Smoking status: Former Research scientist (life sciences)  . Smokeless tobacco: Never Used  Substance and Sexual Activity  . Alcohol use: Yes  . Drug use: Never  . Sexual activity: Not on file  Lifestyle  . Physical activity:    Days per week: Not on file    Minutes per session: Not on file  . Stress: Not on file  Relationships  . Social connections:    Talks on phone: Not on file    Gets together: Not on file    Attends religious service: Not on file    Active member of club or organization: Not on file    Attends meetings of clubs or organizations: Not on file    Relationship status: Not on file  Other Topics Concern  . Not on file  Social History Narrative  . Not on file   Additional Social History:  Sleep: Fair  Appetite:  Fair  Current Medications: Current Facility-Administered Medications  Medication Dose Route Frequency Provider Last Rate Last Dose  . acetaminophen (TYLENOL) tablet 650 mg  650 mg Oral Q6H PRN Lavella Hammock, MD      . alum & mag hydroxide-simeth (MAALOX/MYLANTA) 200-200-20 MG/5ML suspension 30 mL  30 mL Oral Q4H PRN Lavella Hammock, MD      . DULoxetine (CYMBALTA) DR capsule 30 mg  30 mg Oral Daily Evalise Abruzzese B, MD   30 mg at 09/01/18 0909  . feeding supplement (GLUCERNA SHAKE) (GLUCERNA SHAKE) liquid 237 mL  237 mL Oral TID BM Gianny Killman B, MD   237 mL at 09/01/18 1100  . gabapentin (NEURONTIN) capsule 300 mg  300 mg Oral TID Sadeen Wiegel B, MD   300 mg at 08/31/18 1722  . hydrOXYzine (ATARAX/VISTARIL)  tablet 25 mg  25 mg Oral Q4H PRN Lavella Hammock, MD      . insulin aspart (novoLOG) injection 0-15 Units  0-15 Units Subcutaneous TID WC Nataki Mccrumb B, MD   2 Units at 09/01/18 1219  . insulin aspart (novoLOG) injection 0-5 Units  0-5 Units Subcutaneous QHS Dimonique Bourdeau B, MD      . insulin aspart (novoLOG) injection 6 Units  6 Units Subcutaneous TID WC Karime Scheuermann B, MD   6 Units at 09/01/18 1220  . insulin glargine (LANTUS) injection 25 Units  25 Units Subcutaneous Daily Jontez Redfield B, MD   25 Units at 09/01/18 0800  . magnesium hydroxide (MILK OF MAGNESIA) suspension 30 mL  30 mL Oral Daily PRN Lavella Hammock, MD      . mirtazapine (REMERON) tablet 15 mg  15 mg Oral QHS Anyelin Mogle B, MD   15 mg at 08/31/18 2124  . multivitamin with minerals tablet 1 tablet  1 tablet Oral Daily Lavella Hammock, MD   1 tablet at 09/01/18 [phone]  . mupirocin ointment (BACTROBAN) 2 %   Topical Daily Lavella Hammock, MD      . ondansetron [facility]) injection 4 mg  4 mg Intravenous Q8H PRN Lavella Hammock, MD      . pantoprazole (PROTONIX) EC tablet 20 mg  20 mg Oral Daily Lavella Hammock, MD   20 mg at 09/01/18 0102  . protein supplement (ENSURE MAX) liquid  11 oz Oral BID Saydee Zolman B, MD   11 oz at 09/01/18 0900  . thiamine (VITAMIN B-1) tablet 100 mg  100 mg Oral Daily Lavella Hammock, MD   100 mg at 09/01/18 7253  . traZODone (DESYREL) tablet 50 mg  50 mg Oral QHS Lavella Hammock, MD   50 mg at 08/31/18 2124    Lab Results:  Results for orders placed or performed during the hospital encounter of 08/30/18 (from the past 48 hour(s))  Glucose, capillary     Status: Abnormal   Collection Time: 08/30/18  4:27 PM  Result Value Ref Range   Glucose-Capillary 243 (H) 70 - 99 mg/dL  Glucose, capillary     Status: Abnormal   Collection Time: 08/30/18  8:56 PM  Result Value Ref Range   Glucose-Capillary 347 (H) 70 - 99 mg/dL  Glucose, capillary     Status:  Abnormal   Collection Time: 08/31/18  7:06 AM  Result Value Ref Range   Glucose-Capillary 230 (H) 70 - 99 mg/dL   Comment 1 Notify RN   Glucose, capillary     Status: Abnormal  Collection Time: 08/31/18 11:25 AM  Result Value Ref Range   Glucose-Capillary 313 (H) 70 - 99 mg/dL  Glucose, capillary     Status: Abnormal   Collection Time: 08/31/18  4:33 PM  Result Value Ref Range   Glucose-Capillary 365 (H) 70 - 99 mg/dL   Comment 1 Notify RN   Glucose, capillary     Status: Abnormal   Collection Time: 08/31/18  8:32 PM  Result Value Ref Range   Glucose-Capillary 109 (H) 70 - 99 mg/dL  Glucose, capillary     Status: Abnormal   Collection Time: 09/01/18  6:59 AM  Result Value Ref Range   Glucose-Capillary 155 (H) 70 - 99 mg/dL   Comment 1 Notify RN   Glucose, capillary     Status: Abnormal   Collection Time: 09/01/18 11:49 AM  Result Value Ref Range   Glucose-Capillary 130 (H) 70 - 99 mg/dL    Blood Alcohol level:  Lab Results  Component Value Date   ETH 169 (H) 03/55/9741    Metabolic Disorder Labs: Lab Results  Component Value Date   HGBA1C 9.9 (H) 08/27/2018   MPG 237.43 08/27/2018   No results found for: PROLACTIN No results found for: CHOL, TRIG, HDL, CHOLHDL, VLDL, LDLCALC  Physical Findings: AIMS:  , ,  ,  ,    CIWA:  CIWA-Ar Total: 0 COWS:     Musculoskeletal: Strength & Muscle Tone: within normal limits Gait & Station: normal Patient leans: N/A  Psychiatric Specialty Exam: Physical Exam  Nursing note and vitals reviewed. Psychiatric: His speech is normal. Thought content normal. His affect is blunt. He is withdrawn. Cognition and memory are impaired. He expresses impulsivity. He exhibits a depressed mood.    Review of Systems  Neurological: Positive for weakness.  Psychiatric/Behavioral: Positive for depression and substance abuse.  All other systems reviewed and are negative.   Blood pressure 115/86, pulse 91, temperature 98.3 F (36.8 C),  temperature source Oral, resp. rate 18, height _0  (1.803 m), weight 99.8 kg, SpO2 99 %.Body mass index is 30.68 kg/m.  General Appearance: Disheveled  Eye Contact:  Good  Speech:  Clear and Coherent  Volume:  Decreased  Mood:  Anxious, Depressed, Hopeless and Worthless  Affect:  Blunt  Thought Process:  Goal Directed and Descriptions of Associations: Intact  Orientation:  Full (Time, Place, and Person)  Thought Content:  WDL  Suicidal Thoughts:  Yes.  with intent/plan  Homicidal Thoughts:  No  Memory:  Immediate;   Fair Recent;   Fair Remote;   Fair  Judgement:  Poor  Insight:  Lacking  Psychomotor Activity:  Psychomotor Retardation  Concentration:  Concentration: Poor and Attention Span: Poor  Recall:  Poor  Fund of Knowledge:  Poor  Language:  Poor  Akathisia:  No  Handed:  Right  AIMS (if indicated):     Assets:  Communication Skills Desire for Improvement Resilience  ADL's:  Intact  Cognition:  WNL  Sleep:  Number of Hours: 7.15     Treatment Plan Summary: Daily contact with patient to assess and evaluate symptoms and progress in treatment and Medication management   Mr. Clack is a 58 year old male with a history of depression and alcoholism transferred from [facility] medical floor where he was hospitalized after suicide attempt by cutting in the context of relapse on alcohol.  #Suicidal ideation, still suicidal -patient able to contract for safety in the hospital  #Mood, no improved -start Cymbalta 30 mg -Remeron 15 mg -consider  antipsychotic  #Alcohol dependence -detox completed on medical floor -VS are stable -discontinue Neurontin 300 mg TID per patient request  #DM -diabetes nurse coordinator help is greatly appreciated -Lantus 25 units daily -Novolog 6 units with meals -Metformin 1000 mg BID -SSI, ADA diet CBGs  #Weight loss -Ensure Plus TID  #Wrist cuts -twice daily dressing changes -Keflex 500 mg TID  #GERD -Protonix 40 mg  daily  #Deconditioning -PT  Consult appreciated -OK to use walker in the hospital -discontinue 1:1 sitter  #Substance abuse treatment -patient desires residential treatment  #Disposition -will continue attempts to transfer to the [facility] hospital for further treatment  -patient in need of hospitalization, rehab and housing assistance   #Disposition -TBE -homeless  Orson Slick, MD 09/01/2018, 3:16 PM

## 2018-09-02 LAB — GLUCOSE, CAPILLARY
Glucose-Capillary: 218 mg/dL — ABNORMAL HIGH (ref 70–99)
Glucose-Capillary: 163 mg/dL — ABNORMAL HIGH (ref 70–99)
Glucose-Capillary: 82 mg/dL (ref 70–99)
Glucose-Capillary: 82 mg/dL (ref 70–99)

## 2018-09-02 MED ORDER — RISPERIDONE 1 MG PO TABS
2.0000 mg | ORAL_TABLET | Freq: Every day | ORAL | Status: DC
  Administered 2018-09-03: 2 mg via ORAL
  Filled 2018-09-02 (×2): qty 2

## 2018-09-02 MED ORDER — DULOXETINE HCL 30 MG PO CPEP
60.0000 mg | ORAL_CAPSULE | Freq: Every day | ORAL | Status: DC
  Administered 2018-09-03 – 2018-09-05 (×3): 60 mg via ORAL
  Filled 2018-09-02 (×3): qty 2

## 2018-09-02 NOTE — Progress Notes (Signed)
PT Cancellation Note  Patient Details Name: Todd Rangel MRN: [id number] DOB: [birth year]   Cancelled Treatment:    Reason Eval/Treat Not Completed: Other (comment)   Pt in dayroom.  Stated he is walking well.  Stated he gave up his walker today and is walking without assist on unit.  Nurse techs on unit confirm that he is walking without AD and steady.  He declined gait or interventions at this time.     Chesley Noon 09/02/2018, 3:51 PM

## 2018-09-02 NOTE — Plan of Care (Signed)
Patient verbalizes understanding of the general information that's been provided to him and all questions/concerns have been addressed and answered at this time. Patient also verbalized understanding of his condition as well as his discharge needs. Patient has attended/participated in unit activities without any issues at this time. Patient rated his depression a "10/10" stating that "being here and not knowing what my future holds" is making him feel this way. Patient rated his anxiety a "6/10" stating "same stuff, I don't know what's going to happen". Patient has been free from injury thus far and remains safe on the unit at this time.   Problem: Education: Goal: Knowledge of [facility] Education information/materials will improve Outcome: Progressing Goal: Emotional status will improve Outcome: Progressing Goal: Mental status will improve Outcome: Progressing Goal: Verbalization of understanding the information provided will improve Outcome: Progressing   Problem: Safety: Goal: Periods of time without injury will increase Outcome: Progressing   Problem: Education: Goal: Knowledge of disease or condition will improve Outcome: Progressing Goal: Understanding of discharge needs will improve Outcome: Progressing   Problem: Coping: Goal: Coping ability will improve Outcome: Progressing   Problem: Coping: Goal: Coping ability will improve Outcome: Progressing Goal: Will verbalize feelings Outcome: Progressing

## 2018-09-02 NOTE — Progress Notes (Signed)
Recreation Therapy Notes   Date: 09/02/2018  Time: 9:30 am  Location: Craft Room  Behavioral response: Appropriate  Intervention Topic: Strengths  Discussion/Intervention:  Group content today was focused on strengths. The group identified some of the strengths they have. Individuals stated reason why they do not use their strengths. Patients expressed what strengths others see in them. The group identified important reason to use their strengths. The group participated in the intervention "Picking strengths", where they had a chance to identify some of their strengths. Clinical Observations/Feedback:  Patient came to group and defined strengths as endurance. He explained that strengths are important to navigate through everyday life. Individual was social with peers and staff while participating in the intervention. Sherial Ebrahim LRT/CTRS         Gaylynn Seiple 09/02/2018 11:09 AM

## 2018-09-02 NOTE — Progress Notes (Signed)
CSW spoke with Hassan Rowan from [facility]. Hassan Rowan reported that their are beds available in [place] and she was going to make calls to see if she pt can be placed there. Hassan Rowan also agreed to email CSW transfer request from and stated she would call CSW back to inform of whether or not pt will be able to get transferred to [place] facility.  Evalina Field, MSW, LCSW Clinical Social Work 09/02/2018 10:56 AM

## 2018-09-02 NOTE — [facility] Group Notes (Signed)
[facility] Group Notes:  (Nursing/MHT/Case Management/Adjunct)  Date:  09/02/2018  Time:  9:33 PM  Type of Therapy:  Group Therapy  Participation Level:  Active  Participation Quality:  Pt said didn't have too much of a good day. But did go to Groups.  Affect:  Appropriate  Cognitive:  Alert  Insight:  Good  Engagement in Group:  Engaged  Modes of Intervention:  Support  Summary of Progress/Problems:  Todd Rangel 09/02/2018, 9:33 PM

## 2018-09-02 NOTE — Progress Notes (Signed)
[facility] MD Progress Note  09/03/2018 11:30 PM Todd Rangel  MRN:  [id number]  Subjective:    Todd Rangel is a 57 year old veteran with a history of depression and alcoholism admitted after two recent suicide attempts by insulin overdose and cutting in the context of severe social stressors and drinking. Started on antidepressants awaiting transfer to the [facility] hospital.  Todd Rangel better. No beds available at the [facility] [place]. Detox completed. He is no longer suicidal or homicidal. Anticipated discharge this weekend.  Principal Problem: MDD (major depressive disorder), recurrent severe, without psychosis ([place]) Diagnosis: Principal Problem:   MDD (major depressive disorder), recurrent severe, without psychosis ([place]) Active Problems:   Alcohol use disorder, severe, dependence ([place])   Diabetes mellitus without complication ([place])   Suicidal behavior with attempted self-injury ([place])   Open wound of left wrist   Protein-calorie malnutrition, moderate ([place])  Total Time spent with patient: 20 minutes  Past Psychiatric History: depression, alcoholism  Past Medical History:  Past Medical History:  Diagnosis Date  . Alcohol abuse   . Brain tumor ([place])   . Diabetes mellitus without complication ([place])   . 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 CERVICAL FUSION     per pt's report   Family History:  Family History  Problem Relation Age of Onset  . Lung cancer Mother   . Liver cancer Father    Family Psychiatric  History: none Social History:  Social History   Substance and Sexual Activity  Alcohol Use Yes     Social History   Substance and Sexual Activity  Drug Use Never    Social History   Socioeconomic History  . Marital status: Divorced    Spouse name: Not on file  . Number of children: Not on file  . Years of education: Not on file  . Highest education level: Not on file   Occupational History  . Not on file  Social Needs  . Financial resource strain: Not on file  . Food insecurity:    Worry: Not on file    Inability: Not on file  . Transportation needs:    Medical: Not on file    Non-medical: Not on file  Tobacco Use  . Smoking status: Former Research scientist (life sciences)  . Smokeless tobacco: Never Used  Substance and Sexual Activity  . Alcohol use: Yes  . Drug use: Never  . Sexual activity: Not on file  Lifestyle  . Physical activity:    Days per week: Not on file    Minutes per session: Not on file  . Stress: Not on file  Relationships  . Social connections:    Talks on phone: Not on file    Gets together: Not on file    Attends religious service: Not on file    Active member of club or organization: Not on file    Attends meetings of clubs or organizations: Not on file    Relationship status: Not on file  Other Topics Concern  . Not on file  Social History Narrative  . Not on file   Additional Social History:                         Sleep: Poor  Appetite:  Poor  Current Medications: Current Facility-Administered Medications  Medication Dose Route Frequency Provider Last Rate Last Dose  . acetaminophen (TYLENOL) tablet 650  mg  650 mg Oral Q6H PRN Lavella Hammock, MD      . alum & mag hydroxide-simeth (MAALOX/MYLANTA) 200-200-20 MG/5ML suspension 30 mL  30 mL Oral Q4H PRN Lavella Hammock, MD      . DULoxetine (CYMBALTA) DR capsule 60 mg  60 mg Oral Daily Yohana Bartha B, MD   60 mg at 09/03/18 0827  . feeding supplement (GLUCERNA SHAKE) (GLUCERNA SHAKE) liquid 237 mL  237 mL Oral TID BM Chaslyn Eisen B, MD   237 mL at 09/03/18 2000  . hydrOXYzine (ATARAX/VISTARIL) tablet 25 mg  25 mg Oral Q4H PRN Lavella Hammock, MD      . insulin aspart (novoLOG) injection 0-15 Units  0-15 Units Subcutaneous TID WC Shruti Arrey B, MD   2 Units at 09/03/18 1657  . insulin aspart (novoLOG) injection 0-5 Units  0-5 Units Subcutaneous QHS  Cade Dashner B, MD      . insulin aspart (novoLOG) injection 6 Units  6 Units Subcutaneous TID WC Minnie Shi B, MD   6 Units at 09/03/18 1658  . insulin glargine (LANTUS) injection 25 Units  25 Units Subcutaneous Daily Gerson Fauth B, MD   25 Units at 09/03/18 0828  . magnesium hydroxide (MILK OF MAGNESIA) suspension 30 mL  30 mL Oral Daily PRN Lavella Hammock, MD      . mirtazapine (REMERON) tablet 15 mg  15 mg Oral QHS Danney Bungert B, MD   15 mg at 09/03/18 2301  . multivitamin with minerals tablet 1 tablet  1 tablet Oral Daily Lavella Hammock, MD   1 tablet at 09/03/18 0827  . mupirocin ointment (BACTROBAN) 2 %   Topical Daily Lavella Hammock, MD   1 application at 76/16/07 [phone]  . ondansetron (ZOFRAN) injection 4 mg  4 mg Intravenous Q8H PRN Lavella Hammock, MD      . pantoprazole (PROTONIX) EC tablet 20 mg  20 mg Oral Daily Lavella Hammock, MD   20 mg at 09/03/18 0827  . protein supplement (ENSURE MAX) liquid  11 oz Oral BID Lashondra Vaquerano B, MD   11 oz at 09/03/18 1755  . risperiDONE (RISPERDAL) tablet 2 mg  2 mg Oral QHS Marvyn Torrez B, MD   2 mg at 09/03/18 2300  . thiamine (VITAMIN B-1) tablet 100 mg  100 mg Oral Daily Lavella Hammock, MD   100 mg at 09/03/18 6269    Lab Results:  Results for orders placed or performed during the hospital encounter of 08/30/18 (from the past 48 hour(s))  Glucose, capillary     Status: Abnormal   Collection Time: 09/02/18  7:08 AM  Result Value Ref Range   Glucose-Capillary 218 (H) 70 - 99 mg/dL   Comment 1 Notify RN   Glucose, capillary     Status: Abnormal   Collection Time: 09/02/18 10:56 AM  Result Value Ref Range   Glucose-Capillary 163 (H) 70 - 99 mg/dL  Glucose, capillary     Status: None   Collection Time: 09/02/18  4:25 PM  Result Value Ref Range   Glucose-Capillary 82 70 - 99 mg/dL   Comment 1 Notify RN   Glucose, capillary     Status: None   Collection Time: 09/02/18  8:27 PM  Result  Value Ref Range   Glucose-Capillary 82 70 - 99 mg/dL   Comment 1 Notify RN   Glucose, capillary     Status: Abnormal   Collection Time: 09/03/18  7:04  AM  Result Value Ref Range   Glucose-Capillary 195 (H) 70 - 99 mg/dL   Comment 1 Notify RN   Glucose, capillary     Status: Abnormal   Collection Time: 09/03/18 11:13 AM  Result Value Ref Range   Glucose-Capillary 201 (H) 70 - 99 mg/dL  Glucose, capillary     Status: Abnormal   Collection Time: 09/03/18  4:11 PM  Result Value Ref Range   Glucose-Capillary 122 (H) 70 - 99 mg/dL  Glucose, capillary     Status: None   Collection Time: 09/03/18  8:52 PM  Result Value Ref Range   Glucose-Capillary 80 70 - 99 mg/dL   Comment 1 Notify RN     Blood Alcohol level:  Lab Results  Component Value Date   ETH 169 (H) 83/15/1761    Metabolic Disorder Labs: Lab Results  Component Value Date   HGBA1C 9.9 (H) 08/27/2018   MPG 237.43 08/27/2018   No results found for: PROLACTIN No results found for: CHOL, TRIG, HDL, CHOLHDL, VLDL, LDLCALC  Physical Findings: AIMS:  , ,  ,  ,    CIWA:  CIWA-Ar Total: 0 COWS:     Musculoskeletal: Strength & Muscle Tone: within normal limits Gait & Station: normal Patient leans: N/A  Psychiatric Specialty Exam: Physical Exam  Nursing note and vitals reviewed. Psychiatric: His speech is normal. Thought content normal. His affect is blunt. He is slowed and withdrawn. Cognition and memory are normal. He expresses impulsivity.    Review of Systems  Neurological: Negative.   Psychiatric/Behavioral: Positive for depression and substance abuse.  All other systems reviewed and are negative.   Blood pressure 119/82, pulse 91, temperature 99.3 F (37.4 C), temperature source Oral, resp. rate 18, height 5\' 11"  (1.803 m), weight 99.8 kg, SpO2 100 %.Body mass index is 30.68 kg/m.  General Appearance: Fairly Groomed  Eye Contact:  Good  Speech:  Clear and Coherent  Volume:  Normal  Mood:  Depressed,  Hopeless and Worthless  Affect:  Blunt and Depressed  Thought Process:  Goal Directed and Descriptions of Associations: Intact  Orientation:  Full (Time, Place, and Person)  Thought Content:  WDL  Suicidal Thoughts:  No  Homicidal Thoughts:  No  Memory:  Immediate;   Fair Recent;   Fair Remote;   Fair  Judgement:  Poor  Insight:  Lacking  Psychomotor Activity:  Psychomotor Retardation  Concentration:  Concentration: Fair and Attention Span: Fair  Recall:  [employer] of Knowledge:  Fair  Language:  Fair  Akathisia:  No  Handed:  Right  AIMS (if indicated):     Assets:  Communication Skills Desire for Improvement Financial Resources/Insurance Resilience Social Support  ADL's:  Intact  Cognition:  WNL  Sleep:  Number of Hours: 5.3     Treatment Plan Summary: Daily contact with patient to assess and evaluate symptoms and progress in treatment and Medication management   Mr. Hartsell is a 58 year old male with a history of depression and alcoholism transferred from [facility] medical floor where he was hospitalized after suicide attempt by cutting in the context of relapse on alcohol.  #Suicidal ideation, still suicidal -patient able to contract for safety in the hospital  #Mood, no improved -increase Cymbalta to 60 mg -Remeron 15 mg -start Risperdal 2 mg nightly  -discontinue Trazodone  #Alcohol dependence -detox completed on medical floor -VS are stable -discontinue Neurontin 300 mg TIDper patient request  #DM -diabetes nurse coordinator help is greatly appreciated -Lantus 25  units daily -Novolog 6 units with meals -Metformin 1000 mg BID -SSI, ADA diet CBGs  #Weight loss -Ensure PlusTID  #Wrist cuts -twice daily dressing changes -Keflex 500 mg TID  #GERD -Protonix 40 mg daily  #Substance abuse treatment -patient desires residential treatment  #Disposition -will continue attempts to transfer to the [facility] hospital for further treatment -patient in  need ofhospitalization,rehabandhousingassistance  #Disposition -TBE -homeless  Orson Slick, MD 09/03/2018, 11:30 PM

## 2018-09-02 NOTE — Progress Notes (Signed)
D- Patient alert and oriented. Patient presents in a pleasant mood on assessment stating that he's "not for sure" how much sleep he got last night stating "I got 4/5 hours". Patient rated his neck pain a "6/10" reporting that he woke up with a "stiff neck", but he did not request anything from this Probation officer. Patient rated his depression a "10/10" stating that "being here and not knowing what my future holds" is making him feel this way. Patient rated his anxiety a "6/10" stating "same stuff, I don't know what's going to happen". Patient denies SI, HI, AVH, at this time. Patient's goal for today is "full participation", in which he will "engage in groups and others" in order to accomplish this goal.   A- Scheduled medications administered to patient, per MD orders. Support and encouragement provided.  Routine safety checks conducted every 15 minutes.  Patient informed to notify staff with problems or concerns.  R- No adverse drug reactions noted. Patient contracts for safety at this time. Patient compliant with medications and treatment plan. Patient receptive, calm, and cooperative. Patient interacts well with others on the unit.  Patient remains safe at this time.

## 2018-09-02 NOTE — Progress Notes (Signed)
Pastoral Care Consult    09/02/18 1530  Clinical Encounter Type  Visited With Patient  Visit Type Follow-up;Psychological support  Referral From Patient  Consult/Referral To Chaplain  Recommendations continued conversation about shame  Spiritual Encounters  Spiritual Needs Emotional  Stress Factors  Patient Stress Factors Loss;Loss of control   Pt came to group therapy with me for the second time this week.  Pt is very interactive and shows acute intellectual capacity.  Pt stayed afterward to commend me for my empathy which allowed me to affirm his helpful interaction with group.  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  Pt shared that his pursuit of playing football in college and earning a doctorate were attempts to redeem what was lost in his childhood since he was mocked for being fat and stupid. Pt shared about his ongoing struggles with alc and how he gets jobs and then loses them due to his alcoholism.  Melven Sartorius explored with him the poss rationale for why he may be doing this (poss self sabotage as an act of control??).  Pt shared joyfully about his daughter's visit and how she cares about him. Pt reports that she will visit again today and bring him street clothes.  Pt seems much more optimistic and hopeful and desirous to get into a longer term care for his chemical addiction.  Pt also mentioned wanting to finding [place] housing and not work any longer but simply live on his disability money.  Chap listened, challenged pt to think about root issues, and encouraged pt to keep working his plan because he is worth it.  Darcey Nora, Chaplain

## 2018-09-02 NOTE — BHH Group Notes (Signed)
LCSW Group Therapy Note  09/02/2018 1:00 PM  Type of Therapy/Topic:  Group Therapy:  Balance in Life  Participation Level:  Active  Description of Group:    This group will address the concept of balance and how it feels and looks when one is unbalanced. Patients will be encouraged to process areas in their lives that are out of balance and identify reasons for remaining unbalanced. Facilitators will guide patients in utilizing problem-solving interventions to address and correct the stressor making their life unbalanced. Understanding and applying boundaries will be explored and addressed for obtaining and maintaining a balanced life. Patients will be encouraged to explore ways to assertively make their unbalanced needs known to significant others in their lives, using other group members and facilitator for support and feedback.  Therapeutic Goals: 1. Patient will identify two or more emotions or situations they have that consume much of in their lives. 2. Patient will identify signs/triggers that life has become out of balance:  3. Patient will identify two ways to set boundaries in order to achieve balance in their lives:  4. Patient will demonstrate ability to communicate their needs through discussion and/or role plays  Summary of Patient Progress: Patient was present for group.  Patient was supportive of other group members.  Patient engaged in discussion on negative and positive feelings associated with being unbalanced vs. Balanced.  Patient engaged in discussion on boundaries and how to obtain and maintain a balanced life.   Therapeutic Modalities:   Cognitive Behavioral Therapy Solution-Focused Therapy Assertiveness Training  Assunta Curtis MSW, [place] 09/02/2018 2:34 PM

## 2018-09-02 NOTE — Plan of Care (Signed)
Unite guidelines and expected behavior is discussed, hygiene products provided , 15 minutes  observation checks were complete for safety . Patient is provided with education needs reinforcements. Patient was given medications, encouraged patient to attend groups and participate in group activities and continuous with  plan if care , no behavioral concerns contract for safety and denies SI/HI/AVH no distress. Problem: Education: Goal: Knowledge of [facility] Education information/materials will improve Outcome: Progressing Goal: Emotional status will improve Outcome: Progressing Goal: Mental status will improve Outcome: Progressing Goal: Verbalization of understanding the information provided will improve Outcome: Progressing   Problem: Safety: Goal: Periods of time without injury will increase Outcome: Progressing   Problem: Education: Goal: Knowledge of disease or condition will improve Outcome: Progressing Goal: Understanding of discharge needs will improve Outcome: Progressing   Problem: Coping: Goal: Coping ability will improve Outcome: Progressing   Problem: Coping: Goal: Coping ability will improve Outcome: Progressing Goal: Will verbalize feelings Outcome: Progressing

## 2018-09-03 LAB — GLUCOSE, CAPILLARY
GLUCOSE-CAPILLARY: 80 mg/dL (ref 70–99)
Glucose-Capillary: 195 mg/dL — ABNORMAL HIGH (ref 70–99)
Glucose-Capillary: 201 mg/dL — ABNORMAL HIGH (ref 70–99)
Glucose-Capillary: 122 mg/dL — ABNORMAL HIGH (ref 70–99)

## 2018-09-03 NOTE — Plan of Care (Signed)
Problem: Education: Goal: Knowledge of [facility] Education information/materials will improve Outcome: Progressing   Problem: Safety: Goal: Periods of time without injury will increase Outcome: Progressing   Problem: Coping: Goal: Coping ability will improve Outcome: Progressing DAR Note: Pt A & O X4. Denies SI, HI, AVH and pain when assessed "not right this moment". Endorsed feeling hopeless, sad and depressed. Rates his depression and hopelessness 9/10, anxiety 5/10. Pt observed in scheduled groups and was engaged in discussions and activities. Cooperative with CBG monitoring. Compliant with medications when offered; denies side effects or discomfort. Tolerated dressing change well.  Emotional support and encouragement provided to pt. All medications given per MD's orders with verbal education and effects monitored. Dressing change done to left wrist as ordered. Safety checks maintained without self harm or elopement gestures to note this shift. Pt tolerates all PO intake well. Denies concerns at this time. POC continues for safety and mood stability.

## 2018-09-03 NOTE — Progress Notes (Signed)
Patient ID: Todd Rangel, male   DOB: 1960/09/20, 58 y.o.   MRN: [id number] DAR Note: Pt observed in the dayroom not interacting. Pt at assessment complained of moderate anxiety and depression; "I just don't feel right." Pt refused all night medication; "I can't take all these medicines." Pt denied SI/HI, AVH or pain. Pt was calm and cooperative. All patient's questions and concerns addressed. Support, encouragement, and safe environment provided. Will continue to monitor for safety.

## 2018-09-03 NOTE — Progress Notes (Signed)
Recreation Therapy Notes   Date: 09/03/2018  Time: 9:30 am  Location: Craft Room  Behavioral response: Appropriate  Intervention Topic: Teamwork  Discussion/Intervention:  Group content on today was focused on teamwork. The group identified what teamwork is. Individuals described who is a part of their team. Patients expressed why they thought teamwork is important. The group stated reasons why they thought it was easier to work with a Dance movement psychotherapist team. Individuals discussed some positives and negatives of working with a team. Patients gave examples of past experiences they had while working with a team. The group participated in the intervention "What is That", where patients were given a chance to point out qualities they look for in a team mate and were able to work in teams with each other. Clinical Observations/Feedback:  Patient came to group and defined team work as everyone contributing towards a common goal. He stated that everyone has to learn to compromise when working as a team. Individual was social with peers and staff while participating in the intervention. Kellie Murrill LRT/CTRS         Mehar Sagen 09/03/2018 2:30 PM

## 2018-09-03 NOTE — B[facility] Group Notes (Signed)
[facility] Group Notes:  (Nursing/MHT/Case Management/Adjunct)  Date:  09/03/2018  Time:  2:52 PM  Type of Therapy:  Psychoeducational Skills  Participation Level:  Active  Participation Quality:  Appropriate, Attentive, Sharing and Supportive  Affect:  Appropriate  Cognitive:  Alert, Appropriate and Oriented  Insight:  Appropriate, Good and Improving  Engagement in Group:  Developing/Improving, Engaged, Improving and Supportive  Modes of Intervention:  Discussion, Education and Exploration  Summary of Progress/Problems:  Todd Rangel 09/03/2018, 2:52 PM

## 2018-09-03 NOTE — B[facility] Group Notes (Signed)
LCSW Group Therapy Note  09/03/2018 2:27 PM  Type of Therapy and Topic:  Group Therapy:  Feelings around Relapse and Recovery  Participation Level:  Active   Description of Group:    Patients in this group will discuss emotions they experience before and after a relapse. They will process how experiencing these feelings, or avoidance of experiencing them, relates to having a relapse. Facilitator will guide patients to explore emotions they have related to recovery. Patients will be encouraged to process which emotions are more powerful. They will be guided to discuss the emotional reaction significant others in their lives may have to their relapse or recovery. Patients will be assisted in exploring ways to respond to the emotions of others without this contributing to a relapse.  Therapeutic Goals: 1. Patient will identify two or more emotions that lead to a relapse for them 2. Patient will identify two emotions that result when they relapse 3. Patient will identify two emotions related to recovery 4. Patient will demonstrate ability to communicate their needs through discussion and/or role plays   Summary of Patient Progress: Pt was appropriate in group. Pt was active in the group discussion and respectful towards other group members. Pt reported that going to church will assist him in maintaining his recovery, once he is discharged.    Therapeutic Modalities:   Cognitive Behavioral Therapy Solution-Focused Therapy Assertiveness Training Relapse Prevention Therapy   Evalina Field, MSW, LCSW Clinical Social Work 09/03/2018 2:27 PM

## 2018-09-04 LAB — GLUCOSE, CAPILLARY
Glucose-Capillary: 265 mg/dL — ABNORMAL HIGH (ref 70–99)
Glucose-Capillary: 240 mg/dL — ABNORMAL HIGH (ref 70–99)
Glucose-Capillary: 146 mg/dL — ABNORMAL HIGH (ref 70–99)
Glucose-Capillary: 122 mg/dL — ABNORMAL HIGH (ref 70–99)
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
## 2018-09-04 NOTE — BHH Group Notes (Signed)
LCSW Group Therapy Note   09/04/2018 1:15pm   Type of Therapy and Topic:  Group Therapy:  Trust and Honesty  Participation Level:  Active  Description of Group:    In this group patients will be asked to explore the value of being honest.  Patients will be guided to discuss their thoughts, feelings, and behaviors related to honesty and trusting in others. Patients will process together how trust and honesty relate to forming relationships with peers, family members, and self. Each patient will be challenged to identify and express feelings of being vulnerable. Patients will discuss reasons why people are dishonest and identify alternative outcomes if one was truthful (to self or others). This group will be process-oriented, with patients participating in exploration of their own experiences, giving and receiving support, and processing challenge from other group members.   Therapeutic Goals: 1. Patient will identify why honesty is important to relationships and how honesty overall affects relationships.  2. Patient will identify a situation where they lied or were lied too and the  feelings, thought process, and behaviors surrounding the situation 3. Patient will identify the meaning of being vulnerable, how that feels, and how that correlates to being honest with self and others. 4. Patient will identify situations where they could have told the truth, but instead lied and explain reasons of dishonesty.   Summary of Patient Progress: The patient scored his mood at a 8.5 (10 worse.) The patient was able to explore the value of being honest.  Patient discussed thoughts, feelings, and behaviors related to honesty and trusting in others. The patient processed together with other group members how trust and honesty relate to forming relationships with peers, family members, and self. Pt actively and appropriately engaged in the group. Patient was able to provide support and validation to other group  members. Patient practiced active listening when interacting with the facilitator and other group members.    Therapeutic Modalities:   Cognitive Behavioral Therapy Solution Focused Therapy Motivational Interviewing Brief Therapy  Tanika Bracco  CUEBAS-COLON, LCSW 09/04/2018 1:04 PM

## 2018-09-04 NOTE — [facility] Group Notes (Signed)
[facility] Group Notes:  (Nursing/MHT/Case Management/Adjunct)  Date:  09/04/2018  Time:  9:42 PM  Type of Therapy:  Group Therapy  Participation Level:  Active  Participation Quality:  Appropriate  Affect:  Appropriate  Cognitive:  Appropriate  Insight:  Appropriate  Engagement in Group:  Engaged  Modes of Intervention:  Discussion  Summary of Progress/Problems: Todd Rangel reported he was wanted to stay out of his room on this day. Todd Rangel stated he attended group and felt if was helpful. Barnie Mort 09/04/2018, 9:42 PM

## 2018-09-04 NOTE — Plan of Care (Signed)
  Problem: Education: Goal: Emotional status will improve Outcome: Progressing Goal: Mental status will improve Outcome: Progressing   Patient is appropriate upon approach seen patient in the milieu with peers socializing with any issues , engage appropriate with staff and peers, demonstrate self control and maintaining safety, patient denies any SI/HI/AVH but endorses depression at 6/10, and anxiety  at 3/10 scale, voice no concerns on his medication states he feels he is getting better with the medications, patient also wants to know his discharge date, I encourage patient to continue with his treatment regimen and the will tell him the day of discharge , patient is sleeping good , appetite is moderate, energy level adequate,thinks logical and normal, patient contract for safety verbally, 15 minute rounding is in progress no distress noted.

## 2018-09-04 NOTE — Plan of Care (Signed)
Data: Patient is appropriate and cooperative to assessment. Patient denies current SI/HI/AVH. Patient has completed daily self inventory worksheet. Patient reports anxiety and depression. Patient has a pain rating of 0/10. Patient reports fair sleep quality, appetite is good for the last 24 hours. Patient rates depression "10/10" , feelings of hopelessness "9.5/10" and anxiety "10/10" Patients goal for today is left blank. Patient does note "Being asked to go to the [place] I fear I will be turned away and want to harm myself." When asked about this, patient states "They want me to self present, I don't know what I going to do, but my daughter can come and get me."  Action:  Q x 15 minute observation checks were completed for safety. Patient was provided with education on medications. Patient was offered support and encouragement. Patient was given scheduled medications. Patient  was encourage to attend groups, participate in unit activities and continue with plan of care.    Response: Patient is adherent with scheduled medication. Patient has no complaints at this time. Patient is receptive to treatment and safety maintained on unit.    Problem: Safety: Goal: Periods of time without injury will increase Outcome: Progressing   Problem: Education: Goal: Understanding of discharge needs will improve Outcome: Progressing   Problem: Education: Goal: Emotional status will improve Outcome: Not Progressing Goal: Mental status will improve Outcome: Not Progressing Goal: Verbalization of understanding the information provided will improve Outcome: Not Progressing   Problem: Education: Goal: Knowledge of disease or condition will improve Outcome: Not Progressing

## 2018-09-04 NOTE — Progress Notes (Signed)
[facility] MD Progress Note  09/04/2018 12:43 PM Todd Rangel  MRN:  [id number]  Subjective:    Todd Rangel has "the best day so far" but feels foggy. He slept 6 hours without a sleeping pill. He is no longer suicidal but worries about his situation following discharge. He intends to follow up with [facility] clinic in hopes that they be able to provide housing assistance and residential substance abuse treatment program. He fears that if left to his own devices, he will relapse and lo longer be safe. Tolerates medications well. No cravings. Blood glucose is under control.   Principal Problem: MDD (major depressive disorder), recurrent severe, without psychosis ([place]) Diagnosis: Principal Problem:   MDD (major depressive disorder), recurrent severe, without psychosis ([place]) Active Problems:   Alcohol use disorder, severe, dependence ([place])   Diabetes mellitus without complication ([place])   Suicidal behavior with attempted self-injury ([place])   Open wound of left wrist   Protein-calorie malnutrition, moderate ([place])  Total Time spent with patient: 20 minutes  Past Psychiatric History: depression, alcoholism  Past Medical History:  Past Medical History:  Diagnosis Date  . Alcohol abuse   . Brain tumor ([place])   . Diabetes mellitus without complication ([place])   . 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 CERVICAL FUSION     per pt's report   Family History:  Family History  Problem Relation Age of Onset  . Lung cancer Mother   . Liver cancer Father    Family Psychiatric  History: depression  Social History:  Social History   Substance and Sexual Activity  Alcohol Use Yes     Social History   Substance and Sexual Activity  Drug Use Never    Social History   Socioeconomic History  . Marital status: Divorced    Spouse name: Not on file  . Number of children: Not on file  . Years of  education: Not on file  . Highest education level: Not on file  Occupational History  . Not on file  Social Needs  . Financial resource strain: Not on file  . Food insecurity:    Worry: Not on file    Inability: Not on file  . Transportation needs:    Medical: Not on file    Non-medical: Not on file  Tobacco Use  . Smoking status: Former Research scientist (life sciences)  . Smokeless tobacco: Never Used  Substance and Sexual Activity  . Alcohol use: Yes  . Drug use: Never  . Sexual activity: Not on file  Lifestyle  . Physical activity:    Days per week: Not on file    Minutes per session: Not on file  . Stress: Not on file  Relationships  . Social connections:    Talks on phone: Not on file    Gets together: Not on file    Attends religious service: Not on file    Active member of club or organization: Not on file    Attends meetings of clubs or organizations: Not on file    Relationship status: Not on file  Other Topics Concern  . Not on file  Social History Narrative  . Not on file   Additional Social History:                         Sleep: Fair  Appetite:  Fair  Current  Medications: Current Facility-Administered Medications  Medication Dose Route Frequency Provider Last Rate Last Dose  . acetaminophen (TYLENOL) tablet 650 mg  650 mg Oral Q6H PRN Lavella Hammock, MD      . alum & mag hydroxide-simeth (MAALOX/MYLANTA) 200-200-20 MG/5ML suspension 30 mL  30 mL Oral Q4H PRN Lavella Hammock, MD      . DULoxetine (CYMBALTA) DR capsule 60 mg  60 mg Oral Daily Olukemi Panchal B, MD   60 mg at 09/04/18 0816  . feeding supplement (GLUCERNA SHAKE) (GLUCERNA SHAKE) liquid 237 mL  237 mL Oral TID BM Cage Gupton B, MD   237 mL at 09/04/18 1000  . hydrOXYzine (ATARAX/VISTARIL) tablet 25 mg  25 mg Oral Q4H PRN Lavella Hammock, MD      . insulin aspart (novoLOG) injection 0-15 Units  0-15 Units Subcutaneous TID WC Lella Mullany B, MD   5 Units at 09/04/18 1132  . insulin  aspart (novoLOG) injection 0-5 Units  0-5 Units Subcutaneous QHS Dream Nodal B, MD      . insulin aspart (novoLOG) injection 6 Units  6 Units Subcutaneous TID WC Klayton Monie B, MD   6 Units at 09/04/18 1131  . insulin glargine (LANTUS) injection 25 Units  25 Units Subcutaneous Daily Abbott Jasinski B, MD   25 Units at 09/04/18 0816  . magnesium hydroxide (MILK OF MAGNESIA) suspension 30 mL  30 mL Oral Daily PRN Lavella Hammock, MD      . mirtazapine (REMERON) tablet 15 mg  15 mg Oral QHS Wilberto Console B, MD   15 mg at 09/03/18 2301  . multivitamin with minerals tablet 1 tablet  1 tablet Oral Daily Lavella Hammock, MD   1 tablet at 09/04/18 [phone]  . mupirocin ointment (BACTROBAN) 2 %   Topical Daily Lavella Hammock, MD      . ondansetron [facility]) injection 4 mg  4 mg Intravenous Q8H PRN Lavella Hammock, MD      . pantoprazole (PROTONIX) EC tablet 20 mg  20 mg Oral Daily Lavella Hammock, MD   20 mg at 09/04/18 0816  . protein supplement (ENSURE MAX) liquid  11 oz Oral BID Olga Seyler B, MD   11 oz at 09/04/18 0817  . risperiDONE (RISPERDAL) tablet 2 mg  2 mg Oral QHS Reef Achterberg B, MD   2 mg at 09/03/18 2300  . thiamine (VITAMIN B-1) tablet 100 mg  100 mg Oral Daily Lavella Hammock, MD   100 mg at 09/04/18 9604    Lab Results:  Results for orders placed or performed during the hospital encounter of 08/30/18 (from the past 48 hour(s))  Glucose, capillary     Status: None   Collection Time: 09/02/18  4:25 PM  Result Value Ref Range   Glucose-Capillary 82 70 - 99 mg/dL   Comment 1 Notify RN   Glucose, capillary     Status: None   Collection Time: 09/02/18  8:27 PM  Result Value Ref Range   Glucose-Capillary 82 70 - 99 mg/dL   Comment 1 Notify RN   Glucose, capillary     Status: Abnormal   Collection Time: 09/03/18  7:04 AM  Result Value Ref Range   Glucose-Capillary 195 (H) 70 - 99 mg/dL   Comment 1 Notify RN   Glucose, capillary     Status:  Abnormal   Collection Time: 09/03/18 11:13 AM  Result Value Ref Range   Glucose-Capillary 201 (H) 70 - 99  mg/dL  Glucose, capillary     Status: Abnormal   Collection Time: 09/03/18  4:11 PM  Result Value Ref Range   Glucose-Capillary 122 (H) 70 - 99 mg/dL  Glucose, capillary     Status: None   Collection Time: 09/03/18  8:52 PM  Result Value Ref Range   Glucose-Capillary 80 70 - 99 mg/dL   Comment 1 Notify RN   Glucose, capillary     Status: Abnormal   Collection Time: 09/04/18  7:02 AM  Result Value Ref Range   Glucose-Capillary 265 (H) 70 - 99 mg/dL   Comment 1 Notify RN   Glucose, capillary     Status: Abnormal   Collection Time: 09/04/18 11:15 AM  Result Value Ref Range   Glucose-Capillary 240 (H) 70 - 99 mg/dL    Blood Alcohol level:  Lab Results  Component Value Date   ETH 169 (H) 09/98/3382    Metabolic Disorder Labs: Lab Results  Component Value Date   HGBA1C 9.9 (H) 08/27/2018   MPG 237.43 08/27/2018   No results found for: PROLACTIN No results found for: CHOL, TRIG, HDL, CHOLHDL, VLDL, LDLCALC  Physical Findings: AIMS:  , ,  ,  ,    CIWA:  CIWA-Ar Total: 0 COWS:     Musculoskeletal: Strength & Muscle Tone: within normal limits Gait & Station: normal Patient leans: N/A  Psychiatric Specialty Exam: Physical Exam  Nursing note and vitals reviewed. Psychiatric: His speech is normal and behavior is normal. Thought content normal. His mood appears anxious. Cognition and memory are normal. He expresses impulsivity.    Review of Systems  Neurological: Negative.   Psychiatric/Behavioral: Positive for substance abuse.  All other systems reviewed and are negative.   Blood pressure 119/82, pulse 91, temperature 99.3 F (37.4 C), temperature source Oral, resp. rate 18, height 5\' 11"  (1.803 m), weight 99.8 kg, SpO2 100 %.Body mass index is 30.68 kg/m.  General Appearance: Casual  Eye Contact:  Good  Speech:  Clear and Coherent  Volume:  Normal  Mood:   Anxious  Affect:  Appropriate  Thought Process:  Goal Directed and Descriptions of Associations: Intact  Orientation:  Full (Time, Place, and Person)  Thought Content:  WDL  Suicidal Thoughts:  No  Homicidal Thoughts:  No  Memory:  Immediate;   Fair Recent;   Fair Remote;   Fair  Judgement:  Impaired  Insight:  Present  Psychomotor Activity:  Normal  Concentration:  Concentration: Fair and Attention Span: Fair  Recall:  [employer] of Knowledge:  Fair  Language:  Fair  Akathisia:  No  Handed:  Right  AIMS (if indicated):     Assets:  Communication Skills Desire for Improvement Financial Resources/Insurance Resilience Social Support Transportation  ADL's:  Intact  Cognition:  WNL  Sleep:  Number of Hours: 6.45     Treatment Plan Summary: Daily contact with patient to assess and evaluate symptoms and progress in treatment and Medication management   Mr. Suniga is a 58 year old male with a history of depression and alcoholism transferred from [facility] medical floor where he was hospitalized after suicide attempt by insulin overdose and cutting in the context of relapse on alcohol and homelessness.  #Suicidal ideation, resolved -patient able to contract for safety in the hospital  #Mood, improved -increaseCymbaltato 60mg  -Remeron 15 mg -discontinue Risperdal  #Alcohol dependence -detox completed on medical floor -VS are stable  #DM -diabetes nurse coordinator help is greatly appreciated -Lantus 25 units daily -Novolog 6 units  with meals -Metformin 1000 mg BID -SSI, ADA diet CBGs  #Weight loss -Ensure PlusTID  #Wrist cuts, healed -twice daily dressing changes  #GERD -Protonix 40 mg daily  #Substance abuse treatment -patient desires residential treatment  #Disposition -will continue attempts to transfer to the [facility] hospital for further treatment -patient in need ofhospitalization,rehabandhousingassistance -follow up appointment at  [facility] on Tuesday, 09/07/2018  Orson Slick, MD 09/04/2018, 12:43 PM

## 2018-09-05 LAB — GLUCOSE, CAPILLARY
GLUCOSE-CAPILLARY: 192 mg/dL — AB (ref 70–99)
Glucose-Capillary: 242 mg/dL — ABNORMAL HIGH (ref 70–99)

## 2018-09-05 MED ORDER — INSULIN GLARGINE 100 UNIT/ML ~~LOC~~ SOLN: 25.0000 [IU] | Freq: Every day | SUBCUTANEOUS | 11 refills | Status: AC

## 2018-09-05 MED ORDER — MIRTAZAPINE 15 MG PO TABS: 15.0000 mg | ORAL_TABLET | Freq: Every day | ORAL | 1 refills | Status: AC

## 2018-09-05 MED ORDER — METFORMIN HCL 1000 MG PO TABS: 1000.0000 mg | ORAL_TABLET | Freq: Two times a day (BID) | ORAL | 1 refills | Status: AC

## 2018-09-05 MED ORDER — METFORMIN HCL 500 MG PO TABS
1000.0000 mg | ORAL_TABLET | Freq: Two times a day (BID) | ORAL | Status: DC
  Administered 2018-09-05: 1000 mg via ORAL
  Filled 2018-09-05: qty 2

## 2018-09-05 MED ORDER — DULOXETINE HCL 60 MG PO CPEP: 60.0000 mg | ORAL_CAPSULE | Freq: Every day | ORAL | 3 refills | Status: AC

## 2018-09-05 MED ORDER — INSULIN ASPART 100 UNIT/ML ~~LOC~~ SOLN: 6.0000 [IU] | Freq: Three times a day (TID) | SUBCUTANEOUS | 11 refills | Status: AC

## 2018-09-05 MED ORDER — HYDROXYZINE HCL 25 MG PO TABS: 25.0000 mg | ORAL_TABLET | ORAL | 1 refills | Status: AC | PRN

## 2018-09-05 NOTE — Plan of Care (Signed)
Patient is stable and socializing well wit peers in the unite, endorses depression at 4/10 scale , denies SI/HI/AVH,has insights into circumstances  that lead to his hospitalizing, contract for safety,   Patient is engaging appropriately with staff and peers ,appetite is good ,mood is fair and affect is moderate , energy level  normal, voice no concerns at this time  .  Sleep is good restful  and long , coping skills is adequate , support is provided , 15 minute safety rounding is maintained no distress noted.   Problem: Education: Goal: Knowledge of [facility] Education information/materials will improve Outcome: Progressing Goal: Emotional status will improve Outcome: Progressing Goal: Mental status will improve Outcome: Progressing Goal: Verbalization of understanding the information provided will improve Outcome: Progressing   Problem: Safety: Goal: Periods of time without injury will increase Outcome: Progressing   Problem: Education: Goal: Knowledge of disease or condition will improve Outcome: Progressing Goal: Understanding of discharge needs will improve Outcome: Progressing   Problem: Coping: Goal: Coping ability will improve Outcome: Progressing   Problem: Coping: Goal: Coping ability will improve Outcome: Progressing Goal: Will verbalize feelings Outcome: Progressing

## 2018-09-05 NOTE — [facility] Suicide Risk Assessment (Signed)
[facility] Discharge Suicide Risk Assessment   Principal Problem: MDD (major depressive disorder), recurrent severe, without psychosis ([place]) Discharge Diagnoses: Principal Problem:   MDD (major depressive disorder), recurrent severe, without psychosis ([place]) Active Problems:   Alcohol use disorder, severe, dependence ([place])   Diabetes mellitus without complication ([place])   Suicidal behavior with attempted self-injury ([place])   Open wound of left wrist   Protein-calorie malnutrition, moderate ([place])   Total Time spent with patient: 20 minutes  Musculoskeletal: Strength & Muscle Tone: within normal limits Gait & Station: normal Patient leans: N/A  Psychiatric Specialty Exam: Review of Systems  Neurological: Negative.   Psychiatric/Behavioral: Positive for substance abuse.  All other systems reviewed and are negative.   Blood pressure 114/81, pulse 97, temperature 99.5 F (37.5 C), temperature source Oral, resp. rate 18, height 5\' 11"  (1.803 m), weight 99.8 kg, SpO2 100 %.Body mass index is 30.68 kg/m.  General Appearance: Casual  Eye Contact::  Good  Speech:  Clear and Coherent409  Volume:  Normal  Mood:  Anxious  Affect:  Appropriate  Thought Process:  Goal Directed and Descriptions of Associations: Intact  Orientation:  Full (Time, Place, and Person)  Thought Content:  WDL  Suicidal Thoughts:  No  Homicidal Thoughts:  No  Memory:  Immediate;   Fair Recent;   Fair Remote;   Fair  Judgement:  Poor  Insight:  Lacking  Psychomotor Activity:  Normal  Concentration:  Fair  Recall:  [employer] of [place]  Language: Fair  Akathisia:  No  Handed:  Right  AIMS (if indicated):     Assets:  Communication Skills Desire for Improvement Financial Resources/Insurance Resilience Social Support  Sleep:  Number of Hours: 6.45  Cognition: WNL  ADL's:  Intact   Mental Status Per Nursing Assessment::   On Admission:  Self-harm thoughts, Suicide plan, Suicidal ideation indicated by  patient  Demographic Factors:  Male, Divorced or widowed, Caucasian and Low socioeconomic status  Loss Factors: Decrease in vocational status and Financial problems/change in socioeconomic status  Historical Factors: Impulsivity  Risk Reduction Factors:   Sense of responsibility to family and Positive social support  Continued Clinical Symptoms:  Depression:   Comorbid alcohol abuse/dependence Impulsivity Alcohol/Substance Abuse/Dependencies  Cognitive Features That Contribute To Risk:  None    Suicide Risk:  Minimal: No identifiable suicidal ideation.  Patients presenting with no risk factors but with morbid ruminations; may be classified as minimal risk based on the severity of the depressive symptoms  Follow-up [facility] Follow up on 09/07/2018.   Why:  Appointment scheduled with a pharmacologist Tuesday 09/07/18 at 10:30 a.m. Please bring a copy of your discharge paperwork and ask about individual therapy at this appointment so they can get you scheduled with a therapist. Thank you. Contact information: [place] [address] [phone]           Plan Of Care/Follow-up recommendations:  Activity:  as tolerated Diet:  low sodium heart healthy ADA diet Other:  keep follow up appointments  Orson Slick, MD 09/05/2018, 8:54 AM

## 2018-09-05 NOTE — Progress Notes (Signed)
  [facility] Adult Case Management Discharge Plan :  Will you be returning to the same living situation after discharge:  Yes,  [facility] At discharge, do you have transportation home?: Yes,  daughter will pick him up Do you have the ability to pay for your medications: No.  Release of information consent forms completed and in the chart;  Patient's signature needed at discharge.  Patient to Follow up at: Follow-up Information    [facility] Follow up on 09/07/2018.   Why:  Appointment scheduled with a pharmacologist Tuesday 09/07/18 at 10:30 a.m. Please bring a copy of your discharge paperwork and ask about individual therapy at this appointment so they can get you scheduled with a therapist. Thank you. Contact information: [place] [address] [phone]           Next level of care provider has access to [place] and Suicide Prevention discussed: Yes,  CSW discussed with pt  Have you used any form of tobacco in the last 30 days? (Cigarettes, Smokeless Tobacco, Cigars, and/or Pipes): No  Has patient been referred to the Quitline?: N/A patient is not a smoker  Patient has been referred for addiction treatment: Yes  Todd Scorsone  CUEBAS-COLON, LCSW 09/05/2018, 11:05 AM

## 2018-09-05 NOTE — Progress Notes (Signed)
Patient ID: Todd Rangel, male   DOB: 1960-12-03, 58 y.o.   MRN: [id number]   Discharge Note:  Patient denies SI/HI/AVH at this time. Discharge instructions, AVS, prescriptions, and transition record gone over with patient. Patient agrees to comply with medication management, follow-up visit, and outpatient therapy. Patient belongings returned to patient. Patient questions and concerns addressed and answered. Patient ambulatory off unit. Patient discharged to home with daughter and son-in-law.

## 2020-05-03 IMAGING — US US ABDOMEN LIMITED
1 series · 14 of 25 positions shown · non-contrast
Comparison: None.

CLINICAL DATA: 49-year-old male with elevated LFTs.

EXAM:
ULTRASOUND ABDOMEN LIMITED RIGHT UPPER QUADRANT

[Series 1: us abdomen limited · 0.31mm/px · 14 of 46 slices shown]
[im 1/46]
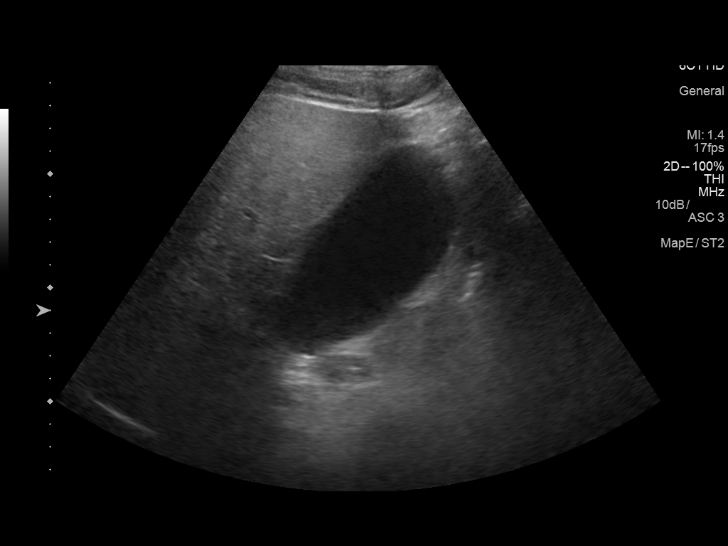
[im 4/46]
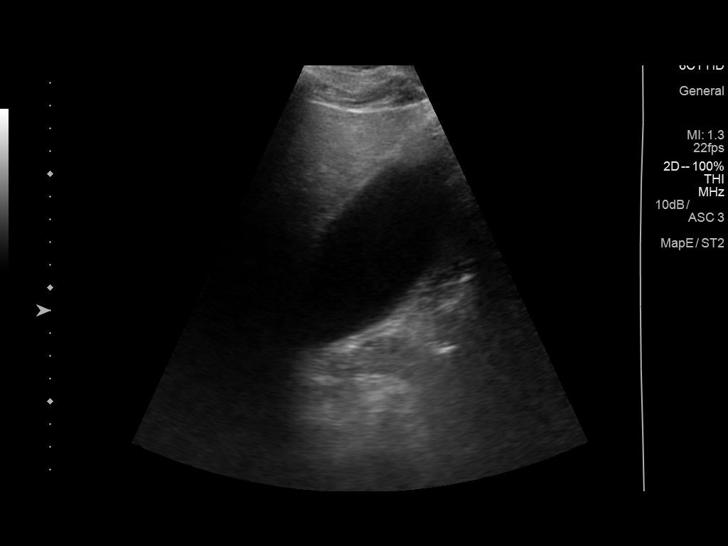
[im 8/46]
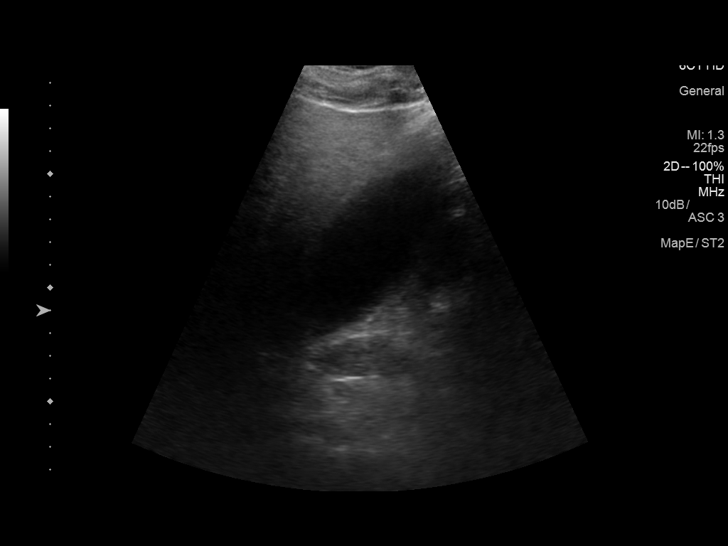
[im 12/46]
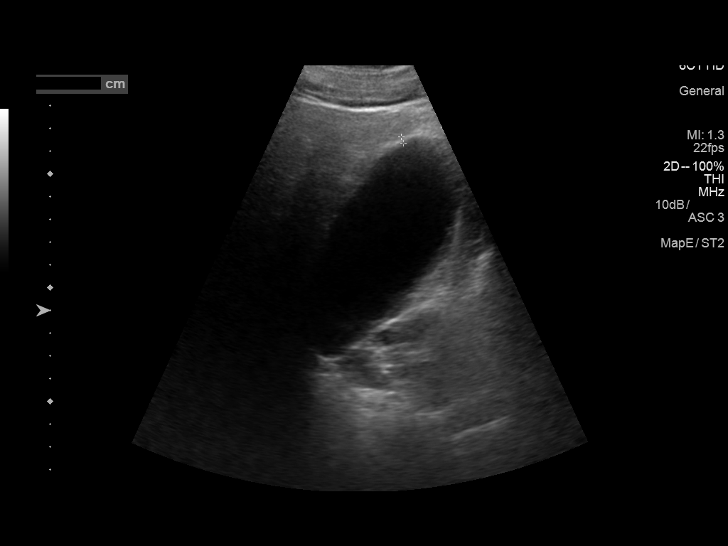
[im 16/46]
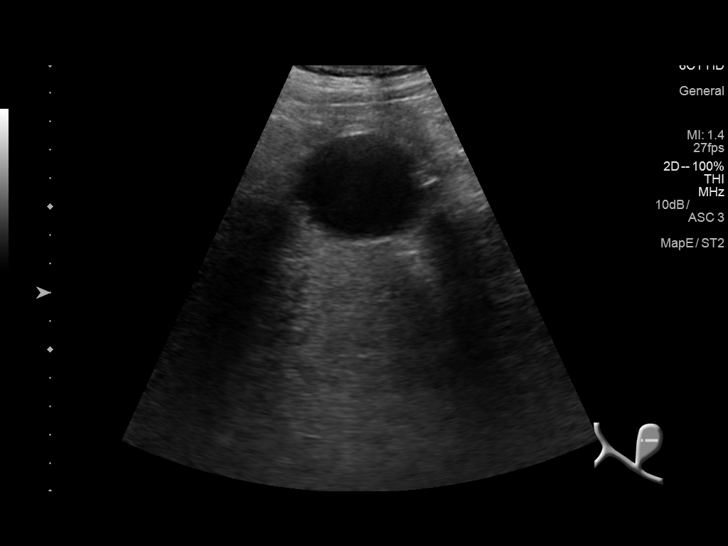
[im 17/46]
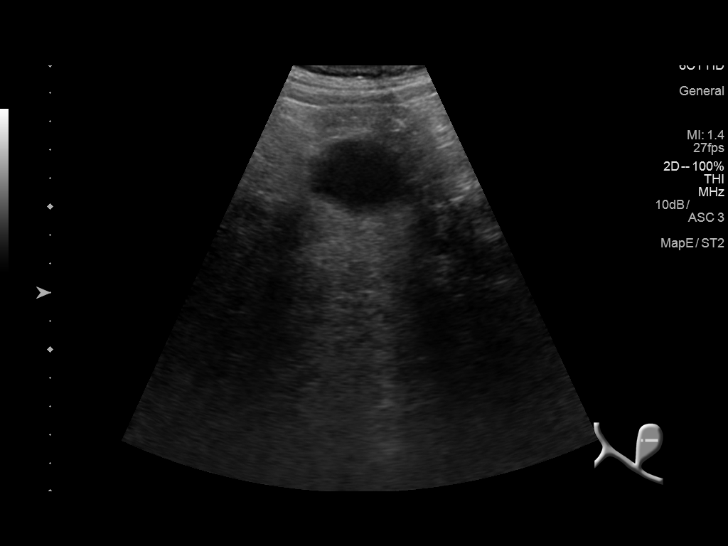
[im 21/46]
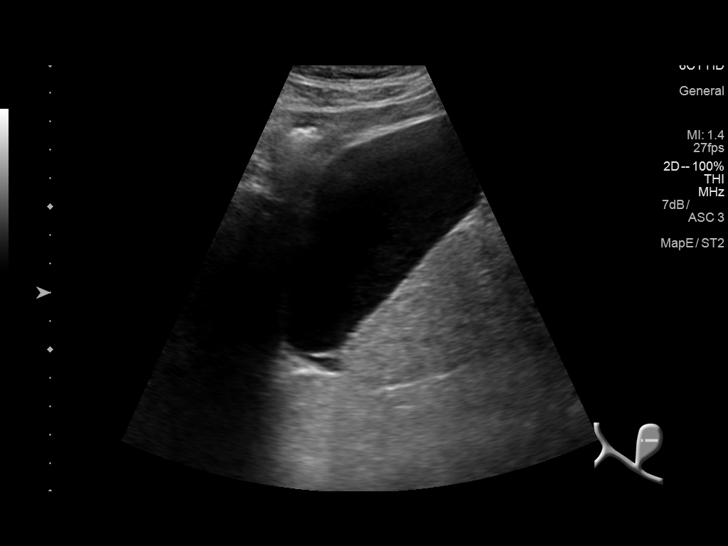
[im 25/46]
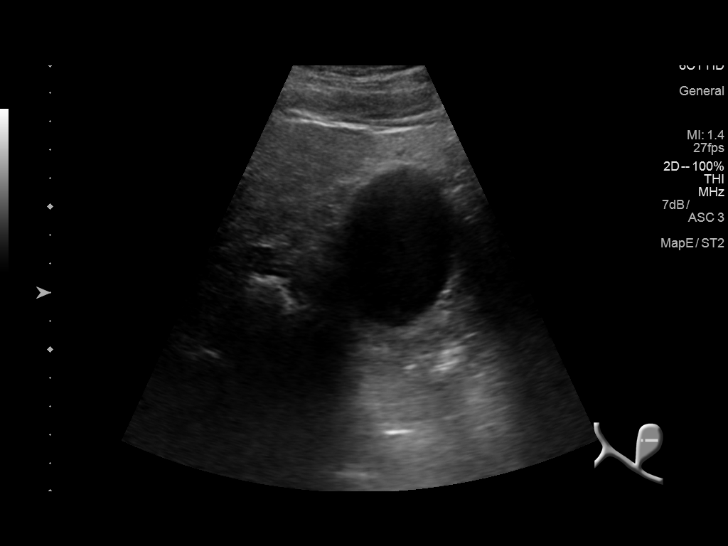
[im 29/46]
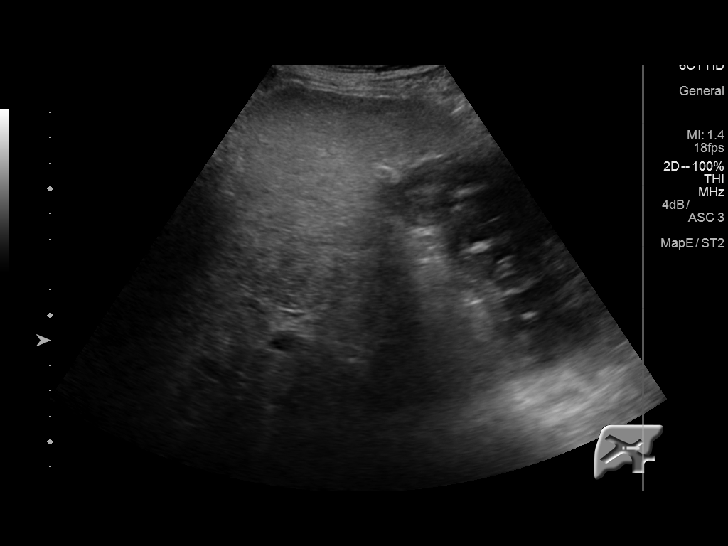
[im 31/46]
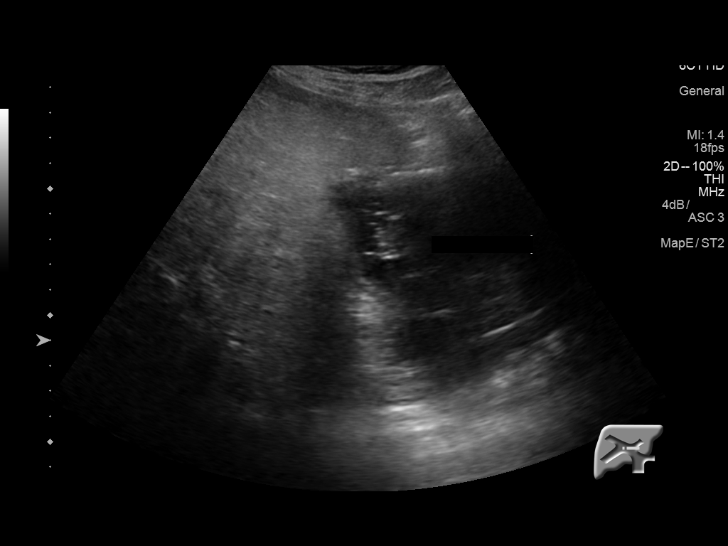
[im 34/46]
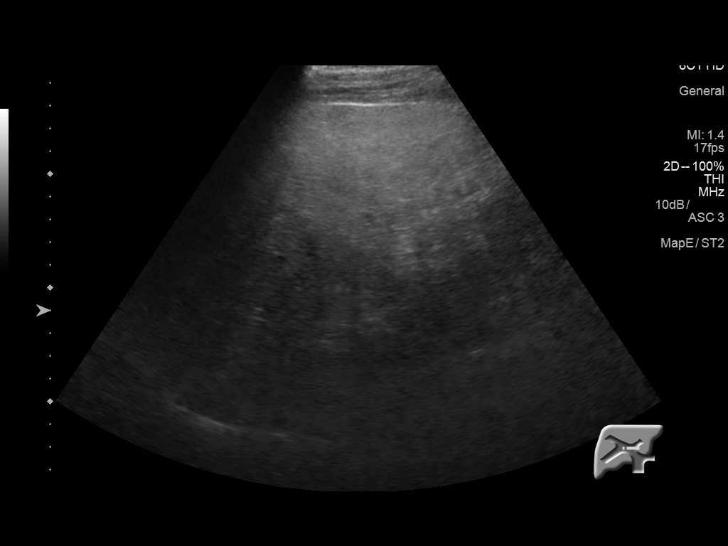
[im 38/46]
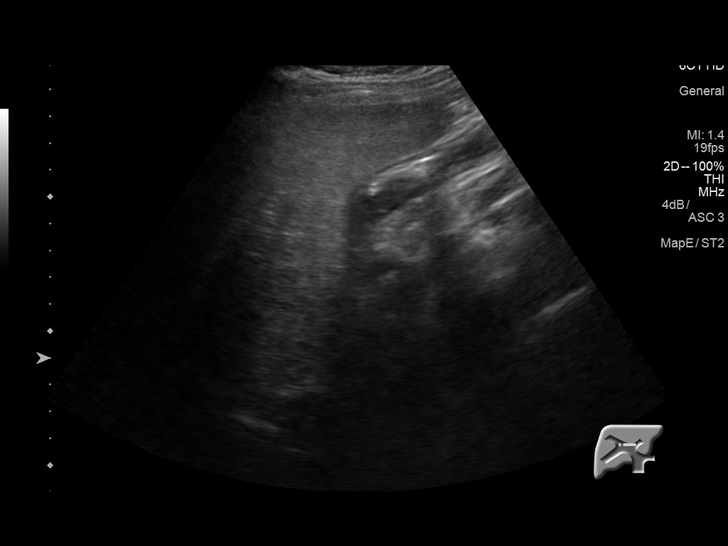
[im 42/46]
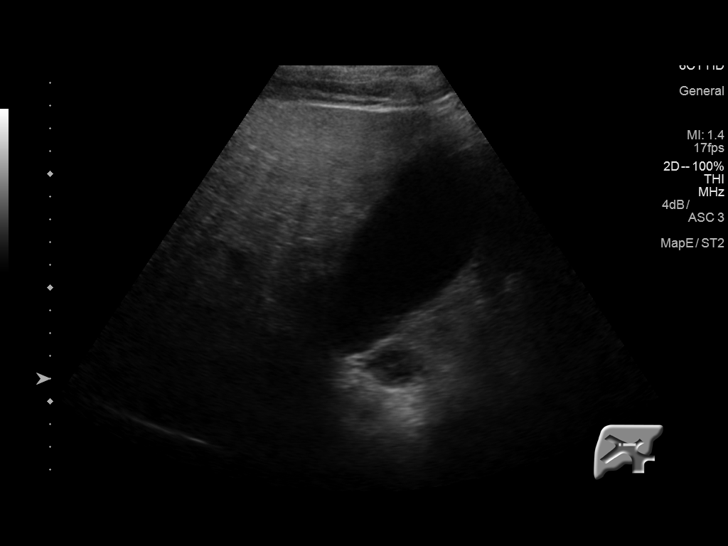
[im 46/46]
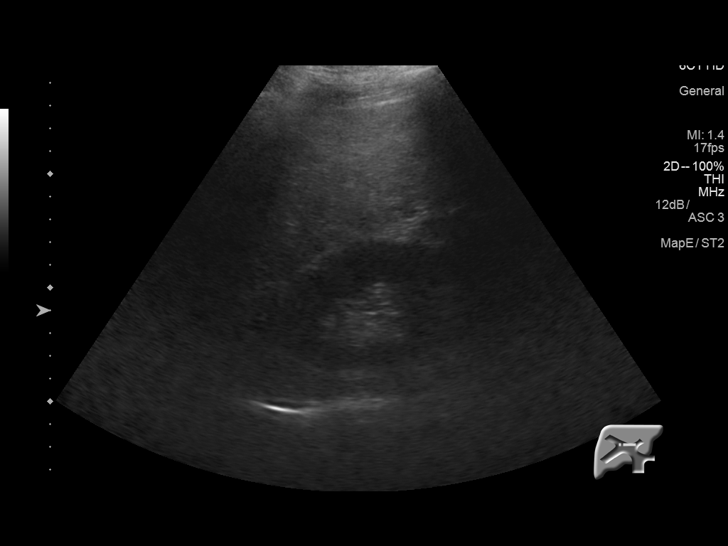

[14 of 25 positions shown; findings below may reference images not displayed]

FINDINGS: Gallbladder:

Small amount of sludge in the gallbladder. No gallstone, gallbladder
wall thickening or pericholecystic fluid. Negative sonographic
Murphy's sign.

Common bile duct:

Not visualized.

Liver:

There is diffuse increased liver echogenicity most consistent with
fatty infiltration. Superimposed fibrosis or inflammation is not
excluded. Clinical correlation is recommended. There is liver is
enlarged measuring approximately 21 cm in length. Portal vein is
patent on color Doppler imaging with normal direction of blood flow
towards the liver.
IMPRESSION: 1. Hepatomegaly with fatty infiltration of the liver.
2. No gallstone.

## 2020-08-31 IMAGING — CT CT HEAD W/O CM
3 series · 15 of 47 positions shown, 18 images · non-contrast
Comparison: None.

CLINICAL DATA: Suicidal attempt. Alcohol related disorder.

EXAM:
CT HEAD WITHOUT CONTRAST
TECHNIQUE: Contiguous axial images were obtained from the base of the skull
through the vertex without intravenous contrast.

[Series 2: head wo · axial · 0.47mm/px · z∈[+1437,+1572]mm · 9 of 33 slices shown, 12 images]
[im 3/33  brain]
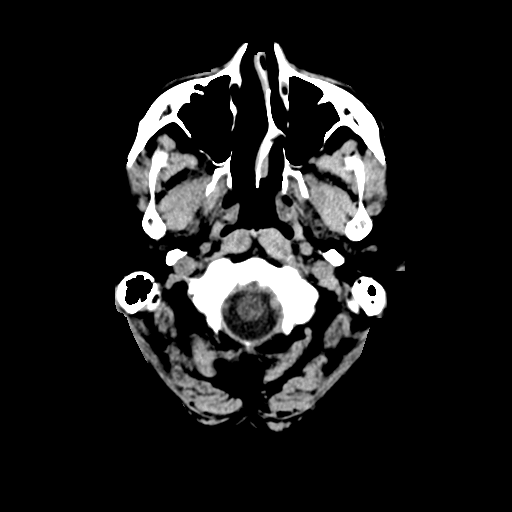
[im 3/33  bone]
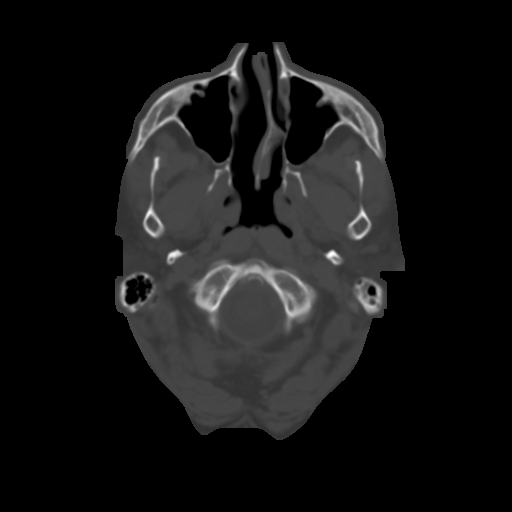
[im 6/33  brain]
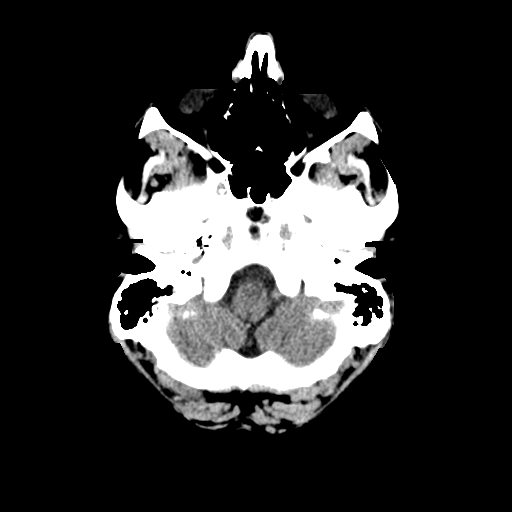
[im 9/33  brain]
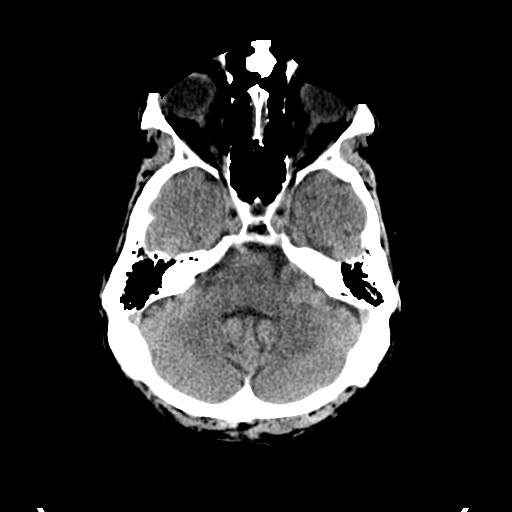
[im 13/33  brain]
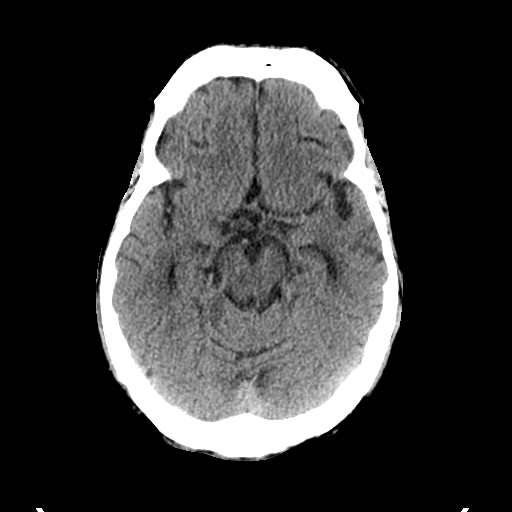
[im 17/33  brain]
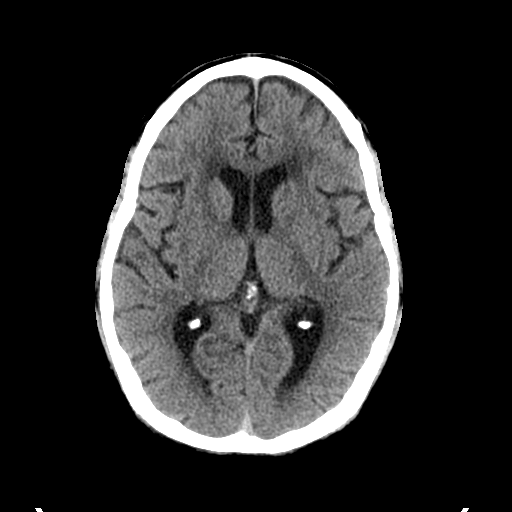
[im 17/33  bone]
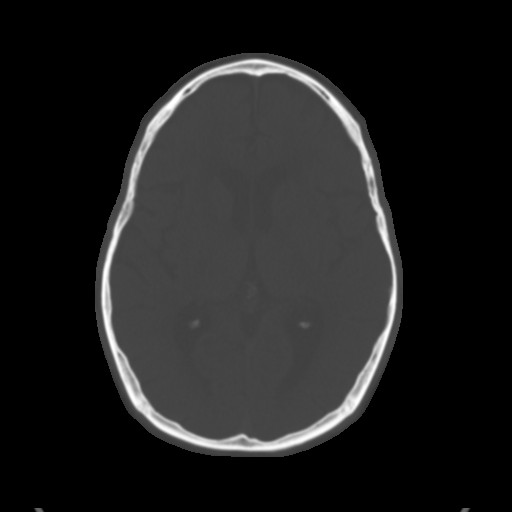
[im 20/33  brain]
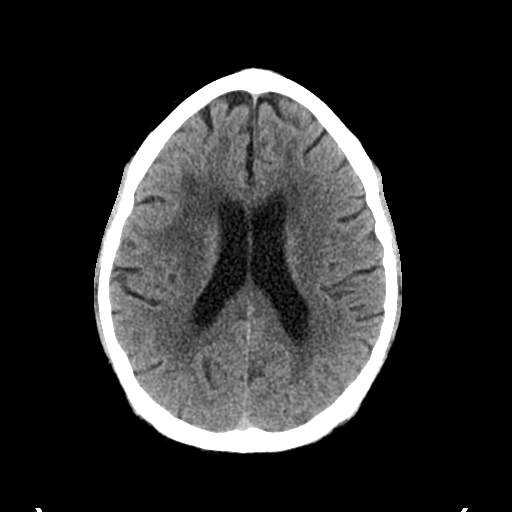
[im 24/33  brain]
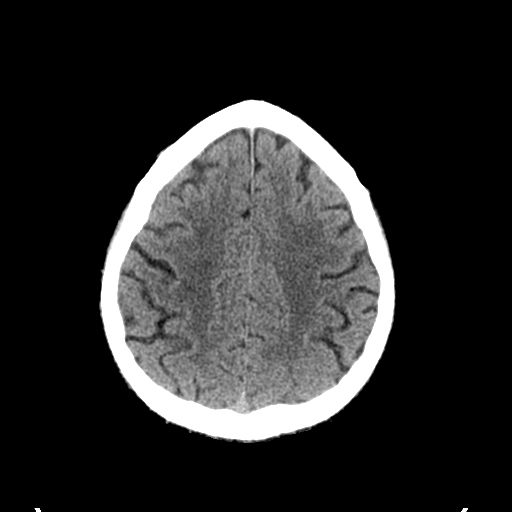
[im 27/33  brain]
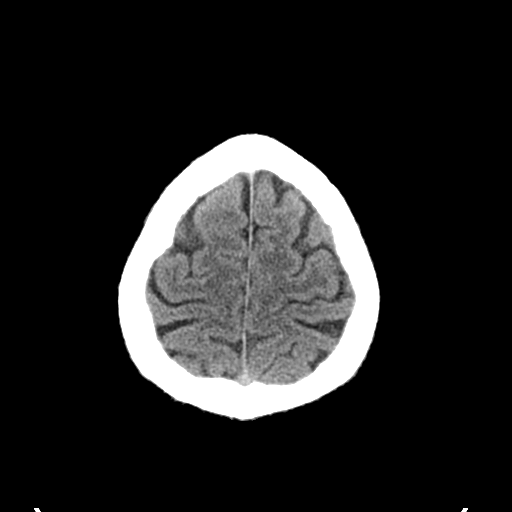
[im 30/33  brain]
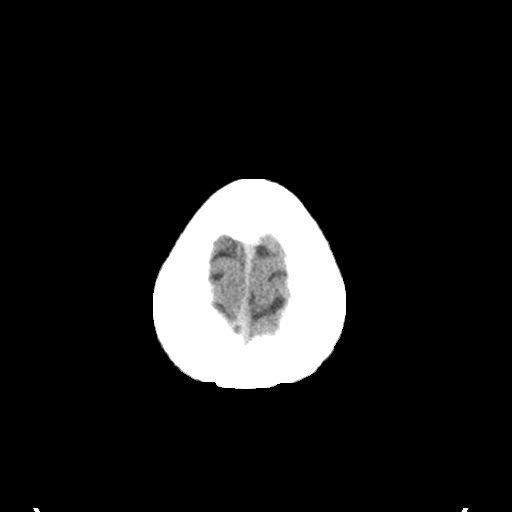
[im 30/33  bone]
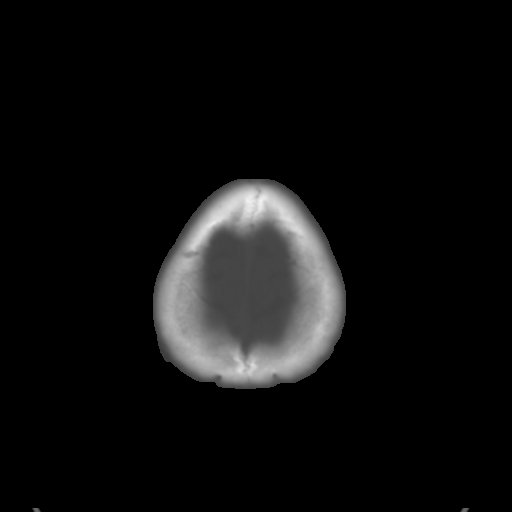

[Series 4: coronal soft tissue · coronal · 0.31mm/px · 3 of 72 slices shown]
[im 24/72  brain]
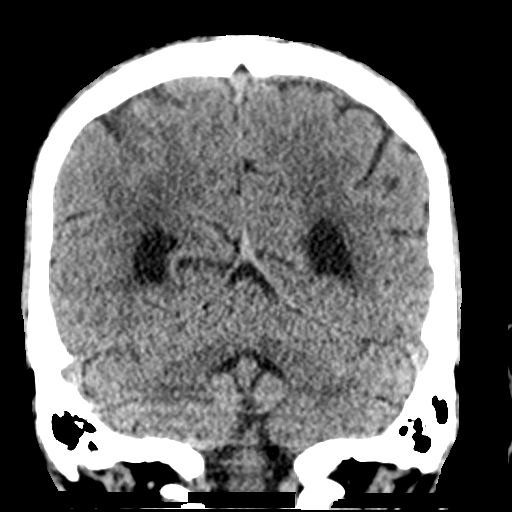
[im 32/72  brain]
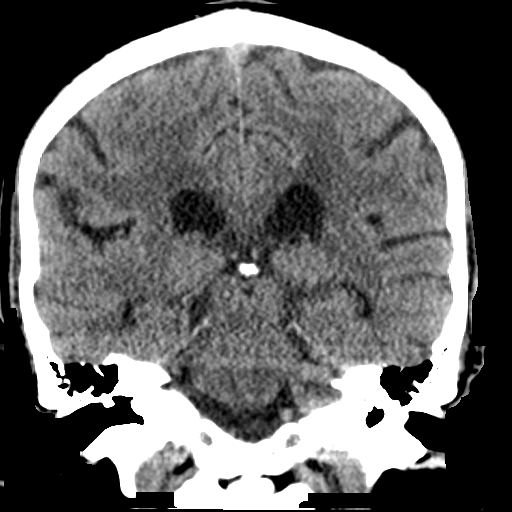
[im 40/72  brain]
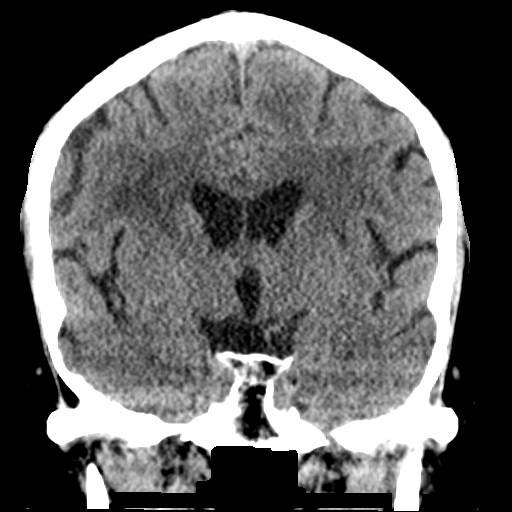

[Series 5: sagittal soft tissue · sagittal · 0.30mm/px · 3 of 52 slices shown]
[im 18/52  brain]
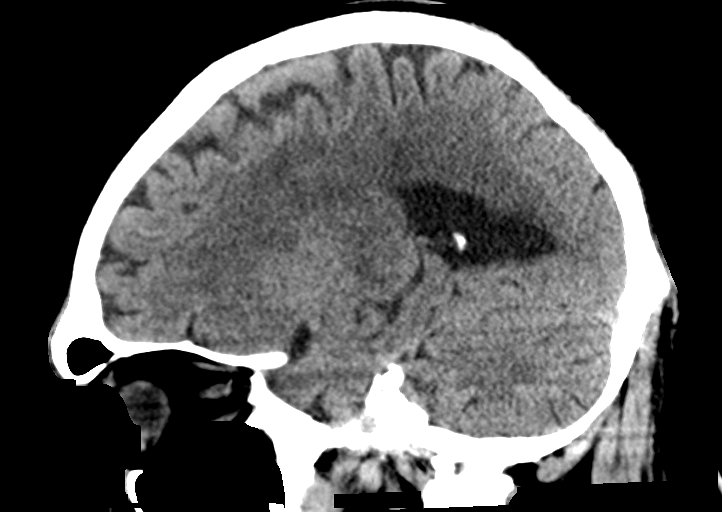
[im 26/52  brain]
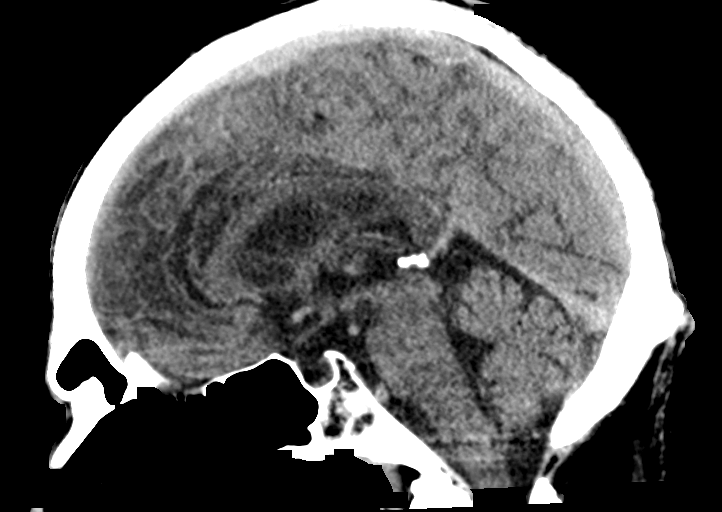
[im 35/52  brain]
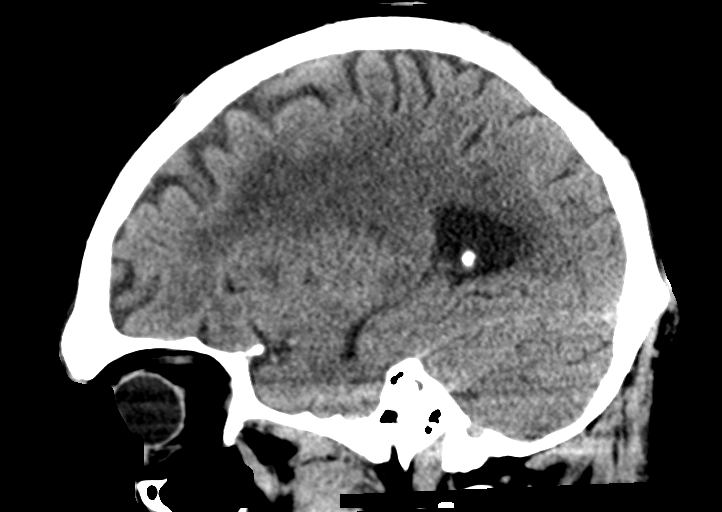

[15 of 47 positions shown; findings below may reference images not displayed]

FINDINGS: Brain: Moderate degree of small vessel ischemia in the
periventricular, deep and subcortical white matter. No acute
intraparenchymal hemorrhage, midline shift or edema. Mild sulcal and
ventricular prominence advanced for age. Midline fourth ventricle
and basal cisterns without effacement. No definite intra-axial mass
nor extra-axial fluid.

Vascular: No hyperdense vessel sign.

Skull: Normal. Negative for fracture or focal lesion.

Sinuses/Orbits: No acute finding.

Other: None
IMPRESSION: 1. Moderate degree of small vessel ischemia in the periventricular,
deep and subcortical white matter. Mild sulcal and ventricular
prominence advanced for age.
2. No acute intracranial abnormality.

## 2023-03-04 ENCOUNTER — Ambulatory Visit: Payer: Self-pay | Admitting: Family Medicine
